# Patient Record
Sex: Female | Born: 1970
Health system: Southern US, Community
[De-identification: ages and names within clinical notes are randomized; demographics above are authoritative.]

## PROBLEM LIST (undated history)

## (undated) DIAGNOSIS — F329 Major depressive disorder, single episode, unspecified: Secondary | ICD-10-CM

## (undated) DIAGNOSIS — L719 Rosacea, unspecified: Secondary | ICD-10-CM

## (undated) DIAGNOSIS — F419 Anxiety disorder, unspecified: Secondary | ICD-10-CM

## (undated) DIAGNOSIS — R42 Dizziness and giddiness: Secondary | ICD-10-CM

## (undated) DIAGNOSIS — Z464 Encounter for fitting and adjustment of orthodontic device: Secondary | ICD-10-CM

## (undated) DIAGNOSIS — K219 Gastro-esophageal reflux disease without esophagitis: Secondary | ICD-10-CM

## (undated) DIAGNOSIS — F32A Depression, unspecified: Secondary | ICD-10-CM

## (undated) DIAGNOSIS — IMO0001 Reserved for inherently not codable concepts without codable children: Secondary | ICD-10-CM

## (undated) DIAGNOSIS — J189 Pneumonia, unspecified organism: Secondary | ICD-10-CM

## (undated) DIAGNOSIS — M67439 Ganglion, unspecified wrist: Secondary | ICD-10-CM

## (undated) DIAGNOSIS — N6320 Unspecified lump in the left breast, unspecified quadrant: Secondary | ICD-10-CM

## (undated) HISTORY — DX: Depression, unspecified: F32.A

## (undated) HISTORY — DX: Unspecified lump in the left breast, unspecified quadrant: N63.20

## (undated) HISTORY — PX: TUBAL LIGATION: SHX77

## (undated) HISTORY — DX: Major depressive disorder, single episode, unspecified: F32.9

## (undated) HISTORY — DX: Anxiety disorder, unspecified: F41.9

---
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
## 2002-06-02 HISTORY — PX: TUBAL LIGATION: SHX77

## 2012-04-10 ENCOUNTER — Emergency Department: Payer: Self-pay | Admitting: Emergency Medicine

## 2012-05-17 ENCOUNTER — Encounter: Payer: Self-pay | Admitting: Specialist

## 2012-06-02 ENCOUNTER — Encounter: Payer: Self-pay | Admitting: Specialist

## 2012-07-03 ENCOUNTER — Encounter: Payer: Self-pay | Admitting: Specialist

## 2012-10-21 ENCOUNTER — Ambulatory Visit: Payer: Self-pay | Admitting: Family Medicine

## 2015-06-29 LAB — HM PAP SMEAR: HM PAP: NEGATIVE

## 2015-07-02 ENCOUNTER — Other Ambulatory Visit: Payer: Self-pay | Admitting: Nurse Practitioner

## 2015-07-02 DIAGNOSIS — N63 Unspecified lump in unspecified breast: Secondary | ICD-10-CM

## 2015-07-05 ENCOUNTER — Other Ambulatory Visit: Payer: Self-pay | Admitting: *Deleted

## 2015-07-05 ENCOUNTER — Inpatient Hospital Stay
Admission: RE | Admit: 2015-07-05 | Discharge: 2015-07-05 | Disposition: A | Discharge status: Home / Self Care | Payer: Self-pay | Source: Ambulatory Visit | Attending: *Deleted | Admitting: *Deleted

## 2015-07-05 DIAGNOSIS — Z9289 Personal history of other medical treatment: Secondary | ICD-10-CM

## 2015-07-17 ENCOUNTER — Other Ambulatory Visit: Payer: Self-pay | Admitting: Nurse Practitioner

## 2015-07-17 ENCOUNTER — Ambulatory Visit
Admission: RE | Admit: 2015-07-17 | Discharge: 2015-07-17 | Disposition: A | Discharge status: Home / Self Care | Payer: Managed Care, Other (non HMO) | Source: Ambulatory Visit | Attending: Nurse Practitioner | Admitting: Nurse Practitioner

## 2015-07-17 DIAGNOSIS — N63 Unspecified lump in unspecified breast: Secondary | ICD-10-CM

## 2015-07-17 DIAGNOSIS — Z1231 Encounter for screening mammogram for malignant neoplasm of breast: Secondary | ICD-10-CM | POA: Diagnosis not present

## 2015-07-17 LAB — HM MAMMOGRAPHY

## 2016-10-08 ENCOUNTER — Ambulatory Visit: Payer: Self-pay | Admitting: Obstetrics and Gynecology

## 2016-10-16 ENCOUNTER — Encounter: Payer: Self-pay | Admitting: Obstetrics and Gynecology

## 2016-10-16 ENCOUNTER — Ambulatory Visit (INDEPENDENT_AMBULATORY_CARE_PROVIDER_SITE_OTHER): Payer: Commercial Managed Care - PPO | Admitting: Obstetrics and Gynecology

## 2016-10-16 VITALS — BP 90/60 | HR 72 | Ht 62.0 in | Wt 147.0 lb

## 2016-10-16 DIAGNOSIS — Z01419 Encounter for gynecological examination (general) (routine) without abnormal findings: Secondary | ICD-10-CM

## 2016-10-16 DIAGNOSIS — Z1239 Encounter for other screening for malignant neoplasm of breast: Secondary | ICD-10-CM

## 2016-10-16 DIAGNOSIS — Z113 Encounter for screening for infections with a predominantly sexual mode of transmission: Secondary | ICD-10-CM

## 2016-10-16 DIAGNOSIS — N938 Other specified abnormal uterine and vaginal bleeding: Secondary | ICD-10-CM | POA: Diagnosis not present

## 2016-10-16 DIAGNOSIS — Z1231 Encounter for screening mammogram for malignant neoplasm of breast: Secondary | ICD-10-CM

## 2016-10-16 DIAGNOSIS — N6320 Unspecified lump in the left breast, unspecified quadrant: Secondary | ICD-10-CM

## 2016-10-16 NOTE — Progress Notes (Signed)
Chief Complaint  Patient presents with  . Gynecologic Exam     HPI:      Ms. Natasha Marshall is a 46 y.o. [id number] who LMP was Patient's last menstrual period was 09/25/2016., presents today for her annual examination.  Her menses are regular every 28-30 days, lasting 2-7 days. Flow can be very light to heavy about every 4-5 months. This is new for pt over the last yr. Her mat side went through menopause in early 52s. Dysmenorrhea mild, occurring first 1-2 days of flow. She does have intermenstrual bleeding now, too, as well as vasomotor sx.   Sex activity: single partner, contraception - tubal ligation. She would like full STD testing.   Last Pap: June 29, 2015  Results were: no abnormalities /neg HPV DNA  Hx of STDs: HPV  Last mammogram: July 17, 2015  Results were: normal--routine follow-up in 12 months There is no FH of breast cancer. There is no FH of ovarian cancer. The patient does do self-breast exams. She has a hx of LT breast mass a few yrs ago that is stable. No change in size.  Tobacco use: The patient denies current or previous tobacco use. Alcohol use: social drinker Exercise: moderately active  She does get adequate calcium and Vitamin D in her diet.  She complains of bad GERD sx after she eats her dinner. Sx not during the days. She takes omeprazole nightly with sx relief. She has not had this evaluated.   Past Medical History:  Diagnosis Date  . Anxiety   . Breast mass, left    benign  . Depression     Past Surgical History:  Procedure Laterality Date  . TONSILLECTOMY  1992  . TUBAL LIGATION  2004    Family History  Problem Relation Age of Onset  . Basal cell carcinoma Maternal Grandmother 70       skin ca  . Colon cancer Maternal Grandmother 19  . COPD Maternal Grandmother   . Coronary artery disease Maternal Grandmother 54  . Stroke Maternal Grandmother   . Cancer Maternal Grandmother 18       uterine     Social History   Social History    . Marital status: Divorced    Spouse name: N/A  . Number of children: 2  . Years of education: 16   Occupational History  . Not on file.   Social History Main Topics  . Smoking status: Former Games developer  . Smokeless tobacco: Former Neurosurgeon  . Alcohol use Yes  . Drug use: No  . Sexual activity: Yes    Birth control/ protection: Surgical   Other Topics Concern  . Not on file   Social History Narrative  . No narrative on file     Current Outpatient Prescriptions:  .  doxycycline 100 mg in dextrose 5 % 250 mL, Inject 100 mg into the vein every 12 (twelve) hours., Disp: , Rfl:  .  ivermectin (STROMECTOL) 3 MG TABS tablet, TAKE 7 TABLETS AND REPEAT IN ONE WEEK., Disp: , Rfl: 3  ROS:  Review of Systems  Constitutional: Negative for fatigue, fever and unexpected weight change.  Respiratory: Negative for cough, shortness of breath and wheezing.   Cardiovascular: Negative for chest pain, palpitations and leg swelling.  Gastrointestinal: Negative for blood in stool, constipation, diarrhea, nausea and vomiting.  Endocrine: Negative for cold intolerance, heat intolerance and polyuria.  Genitourinary: Negative for dyspareunia, dysuria, flank pain, frequency, genital sores, hematuria, menstrual problem, pelvic pain, urgency,  vaginal bleeding, vaginal discharge and vaginal pain.       Breast: mass  Musculoskeletal: Negative for back pain, joint swelling and myalgias.  Skin: Negative for rash.  Neurological: Negative for dizziness, syncope, light-headedness, numbness and headaches.  Hematological: Negative for adenopathy.  Psychiatric/Behavioral: Negative for agitation, confusion, sleep disturbance and suicidal ideas. The patient is not nervous/anxious.      Objective: BP 90/60   Pulse 72   Ht 5\' 2"  (1.575 m)   Wt 147 lb (66.7 kg)   LMP 09/25/2016   BMI 26.89 kg/m    Physical Exam  Constitutional: She is oriented to person, place, and time. She appears well-developed and  well-nourished.  Genitourinary: Vagina normal and uterus normal. There is no rash or tenderness on the right labia. There is no rash or tenderness on the left labia. No erythema or tenderness in the vagina. No vaginal discharge found. Right adnexum does not display mass and does not display tenderness. Left adnexum does not display mass and does not display tenderness. Cervix does not exhibit motion tenderness or polyp. Uterus is not enlarged or tender.  Neck: Normal range of motion. No thyromegaly present.  Cardiovascular: Normal rate, regular rhythm and normal heart sounds.   No murmur heard. Pulmonary/Chest: Effort normal and breath sounds normal. Right breast exhibits no mass, no nipple discharge, no skin change and no tenderness. Left breast exhibits mass. Left breast exhibits no nipple discharge, no skin change and no tenderness.    LT BREAST 2:00 WITH 1.5 CM FIRM, MOBILE, NT MASS  Abdominal: Soft. There is no tenderness. There is no guarding.  Musculoskeletal: Normal range of motion.  Neurological: She is alert and oriented to person, place, and time. No cranial nerve deficit.  Psychiatric: She has a normal mood and affect. Her behavior is normal.  Vitals reviewed.   Assessment/Plan: Encounter for annual routine gynecological examination  Screening for breast cancer - Pt to sched mammo. - Plan: MM SCREENING BREAST TOMO BILATERAL, CANCELED: MM DIGITAL SCREENING BILATERAL  Screening for STD (sexually transmitted disease) - Plan: HIV antibody, RPR, HSV 2 antibody, IgG, Hepatitis C antibody, Chlamydia/Gonococcus/Trichomonas, NAA  DUB (dysfunctional uterine bleeding) - For the past yr. Check labs today, GYN u/s day 5-9 of cycle. Will f/u with results. - Plan: CBC with Differential/Platelet, Comprehensive metabolic panel, TSH + free T4  Breast mass, left - Stable per pt after several yrs of eval. Check mammo anyway.             GYN counsel mammography screening, adequate intake of  calcium and vitamin D, diet and exercise     F/U  Return in about 1 year (around 10/16/2017) for annual; RTO day 5-9 of menses for GYN u/s.  Sharonda Llamas B. Jashua Knaak, PA-C 10/16/2016 5:28 PM

## 2016-10-17 LAB — HIV ANTIBODY (ROUTINE TESTING W REFLEX): HIV SCREEN 4TH GENERATION: NONREACTIVE

## 2016-10-17 LAB — CBC WITH DIFFERENTIAL/PLATELET
BASOS ABS: 0 10*3/uL (ref 0.0–0.2)
Basos: 0 %
EOS (ABSOLUTE): 0.1 10*3/uL (ref 0.0–0.4)
Eos: 2 %
Hematocrit: 39 % (ref 34.0–46.6)
Hemoglobin: 13.2 g/dL (ref 11.1–15.9)
IMMATURE GRANS (ABS): 0 10*3/uL (ref 0.0–0.1)
IMMATURE GRANULOCYTES: 0 %
LYMPHS: 34 %
Lymphocytes Absolute: 2 10*3/uL (ref 0.7–3.1)
MCH: 31.1 pg (ref 26.6–33.0)
MCHC: 33.8 g/dL (ref 31.5–35.7)
MCV: 92 fL (ref 79–97)
MONOCYTES: 8 %
Monocytes Absolute: 0.5 10*3/uL (ref 0.1–0.9)
NEUTROS PCT: 56 %
Neutrophils Absolute: 3.2 10*3/uL (ref 1.4–7.0)
PLATELETS: 331 10*3/uL (ref 150–379)
RBC: 4.25 x10E6/uL (ref 3.77–5.28)
RDW: 12.7 % (ref 12.3–15.4)
WBC: 5.8 10*3/uL (ref 3.4–10.8)

## 2016-10-17 LAB — COMPREHENSIVE METABOLIC PANEL
ALBUMIN: 4.5 g/dL (ref 3.5–5.5)
ALT: 8 IU/L (ref 0–32)
AST: 11 IU/L (ref 0–40)
Albumin/Globulin Ratio: 1.7 (ref 1.2–2.2)
Alkaline Phosphatase: 48 IU/L (ref 39–117)
BILIRUBIN TOTAL: 0.5 mg/dL (ref 0.0–1.2)
BUN / CREAT RATIO: 12 (ref 9–23)
BUN: 10 mg/dL (ref 6–24)
CHLORIDE: 105 mmol/L (ref 96–106)
CO2: 22 mmol/L (ref 18–29)
Calcium: 9.4 mg/dL (ref 8.7–10.2)
Creatinine, Ser: 0.81 mg/dL (ref 0.57–1.00)
GFR calc non Af Amer: 89 mL/min/{1.73_m2} (ref >59)
GFR, EST AFRICAN AMERICAN: 103 mL/min/{1.73_m2} (ref >59)
GLUCOSE: 97 mg/dL (ref 65–99)
Globulin, Total: 2.7 g/dL (ref 1.5–4.5)
Potassium: 4.2 mmol/L (ref 3.5–5.2)
Sodium: 142 mmol/L (ref 134–144)
TOTAL PROTEIN: 7.2 g/dL (ref 6.0–8.5)

## 2016-10-17 LAB — RPR: RPR Ser Ql: NONREACTIVE

## 2016-10-17 LAB — TSH+FREE T4
Free T4: 1.25 ng/dL (ref 0.82–1.77)
TSH: 4.35 u[IU]/mL (ref 0.450–4.500)

## 2016-10-17 LAB — HEPATITIS C ANTIBODY

## 2016-10-17 LAB — HSV 2 ANTIBODY, IGG: HSV 2 Glycoprotein G Ab, IgG: <0.91 index (ref 0.00–0.90)

## 2016-10-20 LAB — CHLAMYDIA/GONOCOCCUS/TRICHOMONAS, NAA
CHLAMYDIA BY NAA: NEGATIVE
Gonococcus by NAA: NEGATIVE
Trich vag by NAA: NEGATIVE

## 2016-10-21 ENCOUNTER — Other Ambulatory Visit: Payer: Self-pay | Admitting: Obstetrics and Gynecology

## 2016-10-21 DIAGNOSIS — N938 Other specified abnormal uterine and vaginal bleeding: Secondary | ICD-10-CM

## 2016-10-28 ENCOUNTER — Ambulatory Visit (INDEPENDENT_AMBULATORY_CARE_PROVIDER_SITE_OTHER): Payer: Commercial Managed Care - PPO

## 2016-10-28 DIAGNOSIS — N938 Other specified abnormal uterine and vaginal bleeding: Secondary | ICD-10-CM

## 2016-11-06 ENCOUNTER — Ambulatory Visit
Admission: EM | Admit: 2016-11-06 | Discharge: 2016-11-06 | Disposition: A | Discharge status: Home / Self Care | Payer: Commercial Managed Care - PPO | Attending: Family Medicine | Admitting: Family Medicine

## 2016-11-06 ENCOUNTER — Ambulatory Visit: Payer: Managed Care, Other (non HMO)

## 2016-11-06 ENCOUNTER — Encounter: Payer: Self-pay | Admitting: *Deleted

## 2016-11-06 DIAGNOSIS — J9801 Acute bronchospasm: Secondary | ICD-10-CM

## 2016-11-06 DIAGNOSIS — J22 Unspecified acute lower respiratory infection: Secondary | ICD-10-CM | POA: Diagnosis not present

## 2016-11-06 HISTORY — DX: Rosacea, unspecified: L71.9

## 2016-11-06 LAB — RAPID STREP SCREEN (MED CTR MEBANE ONLY): Streptococcus, Group A Screen (Direct): NEGATIVE

## 2016-11-06 MED ORDER — PREDNISONE 10 MG (21) PO TBPK: ORAL_TABLET | ORAL | 0 refills | Status: DC

## 2016-11-06 MED ORDER — AZITHROMYCIN 250 MG PO TABS: ORAL_TABLET | ORAL | 0 refills | Status: DC

## 2016-11-06 MED ORDER — IPRATROPIUM-ALBUTEROL 0.5-2.5 (3) MG/3ML IN SOLN
3.0000 mL | Freq: Once | RESPIRATORY_TRACT | Status: AC
Start: 2016-11-06 — End: 2016-11-06
  Administered 2016-11-06: 3 mL via RESPIRATORY_TRACT

## 2016-11-06 MED ORDER — ALBUTEROL SULFATE HFA 108 (90 BASE) MCG/ACT IN AERS: 2.0000 | INHALATION_SPRAY | RESPIRATORY_TRACT | 0 refills | Status: DC | PRN

## 2016-11-06 NOTE — ED Provider Notes (Addendum)
[facility]    CSN: [id number] Arrival date & time: 11/06/16  [id number]     History   Chief Complaint Chief Complaint  Patient presents with  . Cough  . Shortness of Breath  . Sore Throat  . Fatigue  . Chills  . Generalized Body Aches    HPI Natasha Marshall is a 46 y.o. female.   Patient is a 46 year old white female who started having sore throat and coughing on Tuesday things quickly progressed and got worse. She reports having some shortness of breath by today and progressively feeling more tightness in her chest irritation with throat hoarseness and difficulty breathing. She states that she's had bronchitis many times for the past sometimes she states that using an inhaler. She reports coughing up some yellowish-green material and that this has clinically gotten worse. She does not smoke does not dip snuff to tobacco she's had a tubal ligation breast biopsy and tonsillectomy. No known drug allergies and is a history of asthma in the family but she is to have been diagnosed with asthma.   The history is provided by the patient. No language interpreter was used.  Cough  Cough characteristics:  Productive Sputum characteristics:  Nondescript Timing:  Constant Progression:  Waxing and waning Chronicity:  New Smoker: no   Context: upper respiratory infection   Context: not smoke exposure   Relieved by:  Nothing Worsened by:  Nothing Ineffective treatments:  None tried Associated symptoms: shortness of breath, sore throat and wheezing   Shortness of Breath  Associated symptoms: cough, sore throat and wheezing   Sore Throat  Associated symptoms include shortness of breath.    Past Medical History:  Diagnosis Date  . Anxiety   . Breast mass, left    benign  . Depression   . Optical Rosacea     There are no active problems to display for this patient.   Past Surgical History:  Procedure Laterality Date  . TONSILLECTOMY  1992  . TUBAL LIGATION  2004  .  TUBAL LIGATION      OB History    Gravida Para Term Preterm AB Living   2 2 2     2    SAB TAB Ectopic Multiple Live Births           2       Home Medications    Prior to Admission medications   Medication Sig Start Date End Date Taking? Authorizing Provider  doxycycline (VIBRA-TABS) 100 MG tablet Take 200 mg by mouth 2 (two) times daily.   Yes [provider]  albuterol (PROVENTIL HFA;VENTOLIN HFA) 108 (90 Base) MCG/ACT inhaler Inhale 2 puffs into the lungs every 4 (four) hours as needed for wheezing or shortness of breath. 11/06/16   Hassan RowanWade, Hubert Derstine, MD  azithromycin (ZITHROMAX Z-PAK) 250 MG tablet Take 2 tablets first day and then 1 po a day for 4 days 11/06/16   Hassan RowanWade, Farzad Tibbetts, MD  doxycycline 100 mg in dextrose 5 % 250 mL Inject 100 mg into the vein every 12 (twelve) hours.    [provider]  ivermectin (STROMECTOL) 3 MG TABS tablet TAKE 7 TABLETS AND REPEAT IN ONE WEEK. 08/25/16   [provider]  predniSONE (STERAPRED UNI-PAK 21 TAB) 10 MG (21) TBPK tablet Sig 6 tablet day 1, 5 tablets day 2, 4 tablets day 3,,3tablets day 4, 2 tablets day 5, 1 tablet day 6 take all tablets orally 11/06/16   Hassan RowanWade, Lenix Kidd, MD    Family History  Family History  Problem Relation Age of Onset  . Basal cell carcinoma Maternal Grandmother 70       skin ca  . Colon cancer Maternal Grandmother 36  . COPD Maternal Grandmother   . Coronary artery disease Maternal Grandmother 54  . Stroke Maternal Grandmother   . Cancer Maternal Grandmother 42       uterine     Social History Social History  Substance Use Topics  . Smoking status: Former Games developer  . Smokeless tobacco: Former Neurosurgeon  . Alcohol use Yes     Allergies   Patient has no known allergies.   Review of Systems Review of Systems  HENT: Positive for sore throat.   Respiratory: Positive for cough, shortness of breath and wheezing.   All other systems reviewed and are negative.    Physical Exam Triage Vital Signs ED  Triage Vitals  Enc Vitals Group     BP 11/06/16 0851 112/62     Pulse Rate 11/06/16 0851 66     Resp 11/06/16 0851 16     Temp 11/06/16 0851 98.4 F (36.9 C)     Temp Source 11/06/16 0851 Oral     SpO2 11/06/16 0851 100 %     Weight 11/06/16 0853 140 lb (63.5 kg)     Height 11/06/16 0853 5\' 2"  (1.575 m)     Head Circumference --      Peak Flow --      Pain Score --      Pain Loc --      Pain Edu? --      Excl. in GC? --    No data found.   Updated Vital Signs BP 112/62 (BP Location: Left Arm)   Pulse 66   Temp 98.4 F (36.9 C) (Oral)   Resp 16   Ht 5\' 2"  (1.575 m)   Wt 140 lb (63.5 kg)   LMP 10/20/2016 (Exact Date)   SpO2 100%   BMI 25.61 kg/m   Visual Acuity Right Eye Distance:   Left Eye Distance:   Bilateral Distance:    Right Eye Near:   Left Eye Near:    Bilateral Near:     Physical Exam  Constitutional: She is oriented to person, place, and time. She appears well-developed and well-nourished. No distress.  HENT:  Head: Normocephalic and atraumatic.  Eyes: Conjunctivae and EOM are normal. Pupils are equal, round, and reactive to light.  Neck: Normal range of motion. Neck supple.  Cardiovascular: Normal rate and regular rhythm.   Pulmonary/Chest: Effort normal. She has decreased breath sounds. She has no wheezes.  Musculoskeletal: Normal range of motion.  Neurological: She is alert and oriented to person, place, and time.  Skin: Skin is warm and dry.  Psychiatric: She has a normal mood and affect.  Vitals reviewed.    UC Treatments / Results  Labs (all labs ordered are listed, but only abnormal results are displayed) Labs Reviewed  RAPID STREP SCREEN (NOT AT [facility])  CULTURE, GROUP A STREP [facility])    EKG  EKG Interpretation None       Radiology No results found.  Procedures Procedures (including critical care time)  Medications Ordered in UC Medications  ipratropium-albuterol (DUONEB) 0.5-2.5 (3) MG/3ML nebulizer solution 3 mL (3 mLs  Nebulization Given 11/06/16 0908)   Results for orders placed or performed during the hospital encounter of 11/06/16  Rapid strep screen  Result Value Ref Range   Streptococcus, Group A Screen (Direct) NEGATIVE NEGATIVE  Initial Impression / Assessment and Plan / UC Course  I have reviewed the triage vital signs and the nursing notes.  Pertinent labs & imaging results that were available during my care of the patient were reviewed by me and considered in my medical decision making (see chart for details).    patient be given DuoNeb treatment will place her on prednisone 6 day course of Z-Pak and will also give her an albuterol inhaler at work note for today and tomorrow as well follow-up with PCP in 1-2 days if needed.  Patient did feel better after DuoNeb treatment  Final Clinical Impressions(s) / UC Diagnoses   Final diagnoses:  Lower respiratory infection  Acute bronchospasm    New Prescriptions Discharge Medication List as of 11/06/2016  9:28 AM    START taking these medications   Details  albuterol (PROVENTIL HFA;VENTOLIN HFA) 108 (90 Base) MCG/ACT inhaler Inhale 2 puffs into the lungs every 4 (four) hours as needed for wheezing or shortness of breath., Starting Thu 11/06/2016, Normal    azithromycin (ZITHROMAX Z-PAK) 250 MG tablet Take 2 tablets first day and then 1 po a day for 4 days, Normal    predniSONE (STERAPRED UNI-PAK 21 TAB) 10 MG (21) TBPK tablet Sig 6 tablet day 1, 5 tablets day 2, 4 tablets day 3,,3tablets day 4, 2 tablets day 5, 1 tablet day 6 take all tablets orally, Print        Note: This dictation was prepared with Dragon dictation along with smaller phrase technology. Any transcriptional errors that result from this process are unintentional.   Hassan Rowan, MD 11/06/16 1001    Hassan Rowan, MD 11/06/16 1001

## 2016-11-06 NOTE — ED Triage Notes (Signed)
Non-productive cough, dyspnea, chills, sore throat, head and chest congestion x 2 days.

## 2016-11-08 LAB — CULTURE, GROUP A STREP (THRC)

## 2016-11-25 ENCOUNTER — Ambulatory Visit
Admission: RE | Admit: 2016-11-25 | Discharge: 2016-11-25 | Disposition: A | Discharge status: Home / Self Care | Payer: Commercial Managed Care - PPO | Source: Ambulatory Visit | Attending: Obstetrics and Gynecology | Admitting: Obstetrics and Gynecology

## 2016-11-25 DIAGNOSIS — Z1239 Encounter for other screening for malignant neoplasm of breast: Secondary | ICD-10-CM

## 2016-11-25 DIAGNOSIS — Z1231 Encounter for screening mammogram for malignant neoplasm of breast: Secondary | ICD-10-CM | POA: Diagnosis not present

## 2016-11-26 ENCOUNTER — Encounter: Payer: Self-pay | Admitting: Obstetrics and Gynecology

## 2017-07-22 ENCOUNTER — Ambulatory Visit
Admission: EM | Admit: 2017-07-22 | Discharge: 2017-07-22 | Disposition: A | Discharge status: Home / Self Care | Payer: BLUE CROSS/BLUE SHIELD | Attending: Emergency Medicine | Admitting: Emergency Medicine

## 2017-07-22 ENCOUNTER — Other Ambulatory Visit: Payer: Self-pay

## 2017-07-22 DIAGNOSIS — J22 Unspecified acute lower respiratory infection: Secondary | ICD-10-CM

## 2017-07-22 DIAGNOSIS — R059 Cough, unspecified: Secondary | ICD-10-CM

## 2017-07-22 DIAGNOSIS — R05 Cough: Secondary | ICD-10-CM | POA: Diagnosis not present

## 2017-07-22 MED ORDER — ALBUTEROL SULFATE HFA 108 (90 BASE) MCG/ACT IN AERS: 1.0000 | INHALATION_SPRAY | Freq: Four times a day (QID) | RESPIRATORY_TRACT | 0 refills | Status: DC | PRN

## 2017-07-22 MED ORDER — PREDNISONE 10 MG (21) PO TBPK: ORAL_TABLET | ORAL | 0 refills | Status: DC

## 2017-07-22 MED ORDER — AZITHROMYCIN 250 MG PO TABS: 250.0000 mg | ORAL_TABLET | Freq: Every day | ORAL | 0 refills | Status: DC

## 2017-07-22 MED ORDER — BENZONATATE 200 MG PO CAPS: 200.0000 mg | ORAL_CAPSULE | Freq: Three times a day (TID) | ORAL | 0 refills | Status: DC | PRN

## 2017-07-22 MED ORDER — HYDROCOD POLST-CPM POLST ER 10-8 MG/5ML PO SUER: 5.0000 mL | Freq: Two times a day (BID) | ORAL | 0 refills | Status: DC | PRN

## 2017-07-22 MED ORDER — AEROCHAMBER PLUS MISC: 2 refills | Status: DC

## 2017-07-22 MED ORDER — IBUPROFEN 600 MG PO TABS: 600.0000 mg | ORAL_TABLET | Freq: Four times a day (QID) | ORAL | 0 refills | Status: DC | PRN

## 2017-07-22 NOTE — ED Provider Notes (Signed)
HPI  SUBJECTIVE:  Natasha Marshall is a 47 y.o. female who presents with nonproductive cough, shortness of breath, shortness of breath with exertion, chest congestion and rib soreness secondary to the cough starting yesterday.  She states that if she feels that she has "something caught in my throat".  States this feels identical to previous episode of bronchitis.  She tried NyQuil with improvement in her symptoms.  Cough and shortness of breath is worse with lying down.  She denies fevers, body aches.  She reports headaches.  No nasal congestion, rhinorrhea, postnasal drip, sinus pain or pressure, GERD symptoms.  States that she is waking up at night coughing.  No antibiotics in the past month.  No antipyretic in the past 6-8 hours.  She has a past medical history of bronchitis, smoked occasionally in college.  Quit years ago.  She has a past medical history of seasonal allergies but states that they are not bothering her.  History of diabetes, hypertension.  LMP: 1/30.  PMD: None.  Patient was seen here in 10/2016 for the same, thought to have bronchitis, sent home with albuterol, prednisone 6-day taper and a Z-Pak.  States that the albuterol and prednisone helped.  Past Medical History:  Diagnosis Date  . Anxiety   . Breast mass, left    benign  . Depression   . Optical Rosacea     Past Surgical History:  Procedure Laterality Date  . TONSILLECTOMY  1992  . TUBAL LIGATION  2004  . TUBAL LIGATION      Family History  Problem Relation Age of Onset  . Basal cell carcinoma Maternal Grandmother 70       skin ca  . Colon cancer Maternal Grandmother 36  . COPD Maternal Grandmother   . Coronary artery disease Maternal Grandmother 54  . Stroke Maternal Grandmother   . Cancer Maternal Grandmother 49       uterine   . Breast cancer Neg Hx     Social History   Tobacco Use  . Smoking status: Former Games developer  . Smokeless tobacco: Never Used  Substance Use Topics  . Alcohol use: Yes     Comment: social  . Drug use: No    No current facility-administered medications for this encounter.   Current Outpatient Medications:  .  albuterol (PROVENTIL HFA;VENTOLIN HFA) 108 (90 Base) MCG/ACT inhaler, Inhale 1-2 puffs into the lungs every 6 (six) hours as needed for wheezing or shortness of breath., Disp: 1 Inhaler, Rfl: 0 .  azithromycin (ZITHROMAX) 250 MG tablet, Take 1 tablet (250 mg total) by mouth daily. 2 tabs po on day 1, 1 tab po on days 2-5, Disp: 6 tablet, Rfl: 0 .  benzonatate (TESSALON) 200 MG capsule, Take 1 capsule (200 mg total) by mouth 3 (three) times daily as needed for cough., Disp: 30 capsule, Rfl: 0 .  chlorpheniramine-HYDROcodone (TUSSIONEX PENNKINETIC ER) 10-8 MG/5ML SUER, Take 5 mLs by mouth every 12 (twelve) hours as needed for cough., Disp: 120 mL, Rfl: 0 .  ibuprofen (ADVIL,MOTRIN) 600 MG tablet, Take 1 tablet (600 mg total) by mouth every 6 (six) hours as needed., Disp: 30 tablet, Rfl: 0 .  predniSONE (STERAPRED UNI-PAK 21 TAB) 10 MG (21) TBPK tablet, Dispense one 6 day pack. Take as directed with food., Disp: 21 tablet, Rfl: 0 .  Spacer/Aero-Holding Chambers (AEROCHAMBER PLUS) inhaler, Use as instructed, Disp: 1 each, Rfl: 2  No Known Allergies   ROS  As noted in HPI.   Physical Exam  BP 109/66 (BP Location: Left Arm)   Pulse 76   Temp 98.2 F (36.8 C) (Oral)   Resp 18   Ht 5\' 2"  (1.575 m)   Wt 152 lb (68.9 kg)   LMP 07/01/2017   SpO2 100%   BMI 27.80 kg/m   Constitutional: Well developed, well nourished, no acute distress Eyes:  EOMI, conjunctiva normal bilaterally HENT: Normocephalic, atraumatic,mucus membranes moist.  No nasal congestion.  No sinus tenderness.  No postnasal drip.  Normal oropharynx. Respiratory: Normal inspiratory effort lungs clear bilaterally.  Good air movement.  Positive diffuse chest wall tenderness Cardiovascular: Normal rate regular rhythm, no murmurs, rubs, gallops. GI: nondistended skin: No rash, skin  intact Musculoskeletal: no deformities Neurologic: Alert & oriented x 3, no focal neuro deficits Psychiatric: Speech and behavior appropriate   ED Course   Medications - No data to display  No orders of the defined types were placed in this encounter.   No results found for this or any previous visit (from the past 24 hour(s)). No results found.  ED Clinical Impression  Cough  LRTI (lower respiratory tract infection)   ED Assessment/Plan  Previous records reviewed.  As noted in HPI.    Patient with URI, perhaps early bronchitis.  No focal lung findings, 100% on room air, deferring chest x-ray.  No body aches, fevers, deferred flu testing. Pt is amenable to this.  discussed with her that this is most likely viral.  States she no longer has an albuterol inhaler.  Will refill the albuterol inhaler with a spacer, sent home with a prednisone 6-day taper, Tessalon for the cough during the day, Tussionex for the cough at night.  Also wait-and-see azithromycin prescription as she does not have a primary care physician and does not wish to return here for reevaluation.  Advised her to not fill it now.  Return here as needed.  We will also provide a primary care referral list for routine care.  Discussed medical decision-making, plan for follow-up with patient.  She agrees with plan.  [place] Narcotic database reviewed for this patient, and feel that the risk/benefit ratio today is favorable for proceeding with a prescription for controlled substance.  No opiate prescriptions in 2 years.   Meds ordered this encounter  Medications  . Spacer/Aero-Holding Chambers (AEROCHAMBER PLUS) inhaler    Sig: Use as instructed    Dispense:  1 each    Refill:  2  . albuterol (PROVENTIL HFA;VENTOLIN HFA) 108 (90 Base) MCG/ACT inhaler    Sig: Inhale 1-2 puffs into the lungs every 6 (six) hours as needed for wheezing or shortness of breath.    Dispense:  1 Inhaler    Refill:  0  .  chlorpheniramine-HYDROcodone (TUSSIONEX PENNKINETIC ER) 10-8 MG/5ML SUER    Sig: Take 5 mLs by mouth every 12 (twelve) hours as needed for cough.    Dispense:  120 mL    Refill:  0  . benzonatate (TESSALON) 200 MG capsule    Sig: Take 1 capsule (200 mg total) by mouth 3 (three) times daily as needed for cough.    Dispense:  30 capsule    Refill:  0  . ibuprofen (ADVIL,MOTRIN) 600 MG tablet    Sig: Take 1 tablet (600 mg total) by mouth every 6 (six) hours as needed.    Dispense:  30 tablet    Refill:  0  . predniSONE (STERAPRED UNI-PAK 21 TAB) 10 MG (21) TBPK tablet    Sig: Dispense one 6  day pack. Take as directed with food.    Dispense:  21 tablet    Refill:  0  . azithromycin (ZITHROMAX) 250 MG tablet    Sig: Take 1 tablet (250 mg total) by mouth daily. 2 tabs po on day 1, 1 tab po on days 2-5    Dispense:  6 tablet    Refill:  0    *This clinic note was created using Scientist, clinical (histocompatibility and immunogenetics). Therefore, there may be occasional mistakes despite careful proofreading.   ?   Domenick Gong, MD 07/22/17 1043

## 2017-07-22 NOTE — ED Triage Notes (Signed)
Pt reports "I feel just like I did the last time I had bronchitis."

## 2017-07-22 NOTE — Discharge Instructions (Signed)
2 puffs from your albuterol inhaler using a spacer every 4-6 hours as needed for coughing, wheezing, and breath.  Finish the steroids.  Tessalon for the cough during the day, Tussionex for the cough at night.  Do not fill the azithromycin prescription now.  Wait as we discussed.  Follow-up with a primary care physician of your choice.  See list below.  Here is a list of primary care providers who are taking new patients:  Dr. Elizabeth Sauereanna Jones, Dr. Schuyler AmorWilliam Plonk [address] [place] [address] [phone]  [facility] [place] [address] [place] [address]  [place] [address]  [phone]  [facility] [address]  [place], [place] [phone]  [facility] [place] [address]  [phone] [place], [place] [address]  Here are clinics/ other resources who will see you if you do not have insurance. Some have certain criteria that you must meet. Call them and find out what they are:  [facility]: [address] [place] [address] [place], [place] [address] Phone: [phone] Hours: First and Third Saturdays of each Month, 9 a.m. - 1 p.m.  [facility]: [address], [place], [place] [address] Phone: [phone] Hours: Tuesday, 4 p.m. - 8 p.m. Thursday, 1 p.m. - 8 p.m. Wednesday, 9 a.m. - [facility]   [facility] [address], [place], [place] [address] Phone: [phone] Pharmacy Phone Number: [phone] Dental Phone Number: [phone] [facility] Insurance Help: [phone]  Dental Hours: Monday - Thursday, 8 a.m. - 6 p.m.  [facility] [address] [place], [place] [address] Phone: [phone] Pharmacy Phone Number: [phone] [facility] Insurance Help: [phone]  [facility] [address] [place], [place] [address] Phone: [phone] Pharmacy Phone Number: [phone] [facility] Insurance Help: [phone]  [facility] [address] [place],  [place] [address] Phone: [phone] [facility] Insurance Help: [phone]   [facility] [address] [place], [place] [address] Phone: [phone]  Go to www.goodrx.com to look up your medications. This will give you a list of where you can find your prescriptions at the most affordable prices. Or ask the pharmacist what the cash price is, or if they have any other discount programs available to help make your medication more affordable. This can be less expensive than what you would pay with insurance.

## 2017-10-22 ENCOUNTER — Encounter: Payer: Self-pay | Admitting: Obstetrics and Gynecology

## 2017-11-27 ENCOUNTER — Ambulatory Visit (INDEPENDENT_AMBULATORY_CARE_PROVIDER_SITE_OTHER): Payer: BLUE CROSS/BLUE SHIELD | Admitting: Obstetrics and Gynecology

## 2017-11-27 ENCOUNTER — Encounter: Payer: Self-pay | Admitting: Obstetrics and Gynecology

## 2017-11-27 VITALS — BP 98/62 | HR 65 | Ht 62.0 in | Wt 161.0 lb

## 2017-11-27 DIAGNOSIS — Z124 Encounter for screening for malignant neoplasm of cervix: Secondary | ICD-10-CM

## 2017-11-27 DIAGNOSIS — Z1239 Encounter for other screening for malignant neoplasm of breast: Secondary | ICD-10-CM

## 2017-11-27 DIAGNOSIS — Z0000 Encounter for general adult medical examination without abnormal findings: Secondary | ICD-10-CM

## 2017-11-27 DIAGNOSIS — Z01419 Encounter for gynecological examination (general) (routine) without abnormal findings: Secondary | ICD-10-CM

## 2017-11-27 DIAGNOSIS — Z1231 Encounter for screening mammogram for malignant neoplasm of breast: Secondary | ICD-10-CM

## 2017-11-27 DIAGNOSIS — Z113 Encounter for screening for infections with a predominantly sexual mode of transmission: Secondary | ICD-10-CM

## 2017-11-27 NOTE — Progress Notes (Incomplete)
Gynecology Annual Exam  PCP: Patient, No Pcp Per  Chief Complaint:  Chief Complaint  Patient presents with  . Gynecologic Exam    History of Present Illness: Patient is a 47 y.o. [id number] presents for annual exam. The patient has no complaints today.   LMP: Patient's last menstrual period was 09/14/2017 (exact date). Average Interval: irregular, 50-75 days Duration of flow: 5 days Heavy Menses: no Clots: no Intermenstrual Bleeding: no Postcoital Bleeding: no Dysmenorrhea: no   The patient is sexually active. She currently uses tubal ligation for contraception. She denies dyspareunia.  The patient does perform self breast exams.  There is no notable family history of breast or ovarian cancer in her family.  The patient wears seatbelts: yes.   The patient has regular exercise: yes.    The patient denies current symptoms of depression.    Review of Systems: ROS  Past Medical History:  Past Medical History:  Diagnosis Date  . Anxiety   . Breast mass, left    benign  . Depression   . Optical Rosacea     Past Surgical History:  Past Surgical History:  Procedure Laterality Date  . TONSILLECTOMY  1992  . TUBAL LIGATION  2004  . TUBAL LIGATION      Gynecologic History:  Patient's last menstrual period was 09/14/2017 (exact date). Contraception: tubal ligation Last Pap: Results were: 2017 NIL  Last mammogram: 2018 Results were: BI-RAD I  Obstetric History: [id number]: G2P2002  Family History:  Family History  Problem Relation Age of Onset  . Basal cell carcinoma Maternal Grandmother 70       skin ca  . Colon cancer Maternal Grandmother 8764  . COPD Maternal Grandmother   . Coronary artery disease Maternal Grandmother 54  . Stroke Maternal Grandmother   . Cancer Maternal Grandmother 49       uterine   . Breast cancer Neg Hx     Social History:  Social History   Socioeconomic History  . Marital status: Divorced    Spouse name: Not on file  . Number of children: 2   . Years of education: 2916  . Highest education level: Not on file  Occupational History  . Not on file  Social Needs  . Financial resource strain: Not on file  . Food insecurity:    Worry: Not on file    Inability: Not on file  . Transportation needs:    Medical: Not on file    Non-medical: Not on file  Tobacco Use  . Smoking status: Former Games developermoker  . Smokeless tobacco: Never Used  Substance and Sexual Activity  . Alcohol use: Yes    Comment: social  . Drug use: No  . Sexual activity: Yes    Birth control/protection: Surgical    Comment: Tubal ligation   Lifestyle  . Physical activity:    Days per week: Not on file    Minutes per session: Not on file  . Stress: Not on file  Relationships  . Social connections:    Talks on phone: Not on file    Gets together: Not on file    Attends religious service: Not on file    Active member of club or organization: Not on file    Attends meetings of clubs or organizations: Not on file    Relationship status: Not on file  . Intimate partner violence:    Fear of current or ex partner: Not on file    Emotionally abused: Not on  file    Physically abused: Not on file    Forced sexual activity: Not on file  Other Topics Concern  . Not on file  Social History Narrative  . Not on file    Allergies:  No Known Allergies  Medications: Prior to Admission medications   Medication Sig Start Date End Date Taking? Authorizing Provider  Doxycycline Hyclate 200 MG TBEC Take by mouth.   Yes [provider]  ivermectin (STROMECTOL) 3 MG TABS tablet TK 7 TS PO AT ONCE AND REPEAT 1 WEEK LATER. WAIT 2 MONTHS TO REPEAT 11/24/17  Yes [provider]  SOOLANTRA 1 % CREA 1(ONE) APPLICATION(S) TOPICAL EVERY DAY 11/24/17  Yes [provider]    Physical Exam Vitals: Blood pressure 98/62, pulse 65, height 5\' 2"  (1.575 m), weight 161 lb (73 kg), last menstrual period 09/14/2017.  General: NAD HEENT: normocephalic,  anicteric Thyroid: no enlargement, no palpable nodules Pulmonary: No increased work of breathing, CTAB Cardiovascular: RRR, distal pulses 2+ Breast: Breast symmetrical, no tenderness,***, no skin or nipple retraction present, no nipple discharge.  No axillary or supraclavicular lymphadenopathy. Abdomen: NABS, soft, non-tender, non-distended.  Umbilicus without lesions.  No hepatomegaly, splenomegaly or masses palpable. No evidence of hernia  Genitourinary:  External: Normal external female genitalia.  Normal urethral meatus, normal Bartholin's and Skene's glands.    Vagina: Normal vaginal mucosa, no evidence of prolapse.    Cervix: Grossly normal in appearance, no bleeding  Uterus: Non-enlarged, mobile, normal contour.  No CMT  Adnexa: ovaries non-enlarged, no adnexal masses  Rectal: deferred  Lymphatic: no evidence of inguinal lymphadenopathy Extremities: no edema, erythema, or tenderness Neurologic: Grossly intact Psychiatric: mood appropriate, affect full  Female chaperone present for pelvic and breast  portions of the physical exam    Assessment: 47 y.o. G2P2002 routine annual exam  Plan: Problem List Items Addressed This Visit    None    Visit Diagnoses    Health care maintenance    -  Primary   Screening for breast cancer       Encounter for annual routine gynecological examination       Visit for pelvic exam          1) Mammogram - recommend yearly screening mammogram.  Mammogram Was ordered today  2) STI screening  was offered and accepted.   3) ASCCP guidelines and rational discussed.  Patient opts for yearly screening interval  4) Contraception - the patient is currently using  tubal ligation.  She is happy with her current form of contraception and plans to continue  5) Colonoscopy -- Screening recommended starting at age 60 for average risk individuals, age 60 for individuals deemed at increased risk (including African Americans) and recommended to continue  until age 72.  For patient age 31-85 individualized approach is recommended.  Gold standard screening is via colonoscopy, Cologuard screening is an acceptable alternative for patient unwilling or unable to undergo colonoscopy.  "Colorectal cancer screening for average?risk adults: 2018 guideline update from the American Cancer Society"CA: A Cancer Journal for Clinicians: Oct 29, 2016   6) Routine healthcare maintenance including cholesterol, diabetes screening discussed Ordered today. She will follow up for her lipid panel.  7) No follow-ups on file.  Adelene Idler MD [facility], [facility] 11/27/17 2:50 PM

## 2017-12-01 LAB — CBC WITH DIFFERENTIAL
BASOS ABS: 0 10*3/uL (ref 0.0–0.2)
BASOS: 0 %
EOS (ABSOLUTE): 0.1 10*3/uL (ref 0.0–0.4)
Eos: 2 %
HEMATOCRIT: 40.6 % (ref 34.0–46.6)
HEMOGLOBIN: 13.1 g/dL (ref 11.1–15.9)
IMMATURE GRANS (ABS): 0 10*3/uL (ref 0.0–0.1)
Immature Granulocytes: 0 %
LYMPHS ABS: 2.6 10*3/uL (ref 0.7–3.1)
LYMPHS: 35 %
MCH: 30.5 pg (ref 26.6–33.0)
MCHC: 32.3 g/dL (ref 31.5–35.7)
MCV: 95 fL (ref 79–97)
MONOCYTES: 7 %
Monocytes Absolute: 0.5 10*3/uL (ref 0.1–0.9)
NEUTROS ABS: 4.1 10*3/uL (ref 1.4–7.0)
Neutrophils: 56 %
RBC: 4.29 x10E6/uL (ref 3.77–5.28)
RDW: 13 % (ref 12.3–15.4)
WBC: 7.5 10*3/uL (ref 3.4–10.8)

## 2017-12-01 LAB — HSV(HERPES SMPLX)ABS-I+II(IGG+IGM)-BLD
HSV 1 Glycoprotein G Ab, IgG: 26 index — ABNORMAL HIGH (ref 0.00–0.90)
HSV 2 IgG, Type Spec: <0.91 index (ref 0.00–0.90)
HSVI/II Comb IgM: <0.91 Ratio (ref 0.00–0.90)

## 2017-12-01 LAB — COMPREHENSIVE METABOLIC PANEL
A/G RATIO: 1.7 (ref 1.2–2.2)
ALT: 15 IU/L (ref 0–32)
AST: 15 IU/L (ref 0–40)
Albumin: 4.7 g/dL (ref 3.5–5.5)
Alkaline Phosphatase: 65 IU/L (ref 39–117)
BILIRUBIN TOTAL: 0.4 mg/dL (ref 0.0–1.2)
BUN/Creatinine Ratio: 19 (ref 9–23)
BUN: 15 mg/dL (ref 6–24)
CHLORIDE: 105 mmol/L (ref 96–106)
CO2: 22 mmol/L (ref 20–29)
Calcium: 9.6 mg/dL (ref 8.7–10.2)
Creatinine, Ser: 0.77 mg/dL (ref 0.57–1.00)
GFR calc non Af Amer: 94 mL/min/{1.73_m2} (ref >59)
GFR, EST AFRICAN AMERICAN: 109 mL/min/{1.73_m2} (ref >59)
GLOBULIN, TOTAL: 2.7 g/dL (ref 1.5–4.5)
GLUCOSE: 90 mg/dL (ref 65–99)
Potassium: 4.2 mmol/L (ref 3.5–5.2)
SODIUM: 140 mmol/L (ref 134–144)
TOTAL PROTEIN: 7.4 g/dL (ref 6.0–8.5)

## 2017-12-01 LAB — HEPATITIS C ANTIBODY

## 2017-12-01 LAB — HEP, RPR, HIV PANEL
HIV Screen 4th Generation wRfx: NONREACTIVE
Hepatitis B Surface Ag: NEGATIVE
RPR Ser Ql: NONREACTIVE

## 2017-12-01 NOTE — Progress Notes (Signed)
HSV1 positive, other labs normal, released to mychart.

## 2017-12-02 LAB — PAPIG, CTNGTV, HPV, RFX 16/18
Chlamydia, Nuc. Acid Amp: NEGATIVE
GONOCOCCUS, NUC. ACID AMP: NEGATIVE
HPV, HIGH-RISK: NEGATIVE
PAP SMEAR COMMENT: 0
Trich vag by NAA: NEGATIVE

## 2017-12-17 ENCOUNTER — Other Ambulatory Visit: Payer: BLUE CROSS/BLUE SHIELD

## 2018-12-01 DIAGNOSIS — L578 Other skin changes due to chronic exposure to nonionizing radiation: Secondary | ICD-10-CM | POA: Diagnosis not present

## 2018-12-01 DIAGNOSIS — L718 Other rosacea: Secondary | ICD-10-CM | POA: Diagnosis not present

## 2018-12-31 ENCOUNTER — Ambulatory Visit: Payer: BLUE CROSS/BLUE SHIELD | Admitting: Obstetrics and Gynecology

## 2019-01-21 ENCOUNTER — Ambulatory Visit (INDEPENDENT_AMBULATORY_CARE_PROVIDER_SITE_OTHER): Payer: BC Managed Care – PPO | Admitting: Maternal Newborn

## 2019-01-21 ENCOUNTER — Other Ambulatory Visit: Payer: Self-pay

## 2019-01-21 ENCOUNTER — Encounter: Payer: Self-pay | Admitting: Maternal Newborn

## 2019-01-21 VITALS — BP 100/80 | Ht 62.0 in | Wt 169.4 lb

## 2019-01-21 DIAGNOSIS — Z1231 Encounter for screening mammogram for malignant neoplasm of breast: Secondary | ICD-10-CM

## 2019-01-21 DIAGNOSIS — Z01419 Encounter for gynecological examination (general) (routine) without abnormal findings: Secondary | ICD-10-CM | POA: Diagnosis not present

## 2019-01-21 NOTE — Patient Instructions (Signed)

## 2019-01-21 NOTE — Progress Notes (Signed)
Gynecology Annual Exam  PCP: Natasha Marshall, No Pcp Per  Chief Complaint:  Chief Complaint  Natasha Marshall presents with  . Gynecologic Exam    lot of heartburn lately    History of Present Illness: Natasha Marshall is a 48 y.o. [id number] presenting for an annual exam.   LMP: Natasha Marshall's last menstrual period was 12/12/2018 (exact date). Average Interval: irregular, has had 4 cycles in the past year Duration of flow: Varies from 2 days heavy flow to 4 days light flow Intermenstrual Bleeding: no Postcoital Bleeding: no Dysmenorrhea: no  The Natasha Marshall is sexually active. She currently uses tubal ligation for contraception. She does not have dyspareunia.  The Natasha Marshall does perform self breast exams.  There is no notable family history of breast or ovarian cancer in her family.  The Natasha Marshall wears seatbelts: yes.   The Natasha Marshall has regular exercise: yes, walks 1 mile almost every day.    The Natasha Marshall does not have current symptoms of depression.  Domestic violence screening is negative.    She is taking OTC omeprazole for heartburn. Does not desire to try any medication for hot flashes.  She has had a stable left breast mass for the past few years, no changes in size  Review of Systems  Constitutional: Negative.   HENT: Negative.   Eyes: Negative.   Respiratory: Negative for shortness of breath and wheezing.   Cardiovascular: Negative for chest pain and palpitations.  Gastrointestinal: Positive for heartburn.  Genitourinary: Negative.   Musculoskeletal: Negative.   Skin: Negative.   Neurological: Negative.   Endo/Heme/Allergies: Positive for environmental allergies.       Hot flashes  Psychiatric/Behavioral: Negative.   All other systems reviewed and are negative.   Past Medical History:  Past Medical History:  Diagnosis Date  . Anxiety   . Breast mass, left    benign  . Depression   . Optical Rosacea     Past Surgical History:  Past Surgical History:  Procedure Laterality Date  .  TONSILLECTOMY  1992  . TUBAL LIGATION  2004  . TUBAL LIGATION      Gynecologic History:  Natasha Marshall's last menstrual period was 12/12/2018 (exact date). Contraception: tubal ligation Last Pap: 11/27/2017. Results were:  NIL and HR HPV negative  Last mammogram: 11/26/2016.  Results were: BI-RAD I Obstetric History: [id number]  Family History:  Family History  Problem Relation Age of Onset  . Basal cell carcinoma Maternal Grandmother 70       skin ca  . Colon cancer Maternal Grandmother 1364  . COPD Maternal Grandmother   . Coronary artery disease Maternal Grandmother 54  . Stroke Maternal Grandmother   . Cancer Maternal Grandmother 49       uterine   . Breast cancer Neg Hx     Social History:  Social History   Socioeconomic History  . Marital status: Divorced    Spouse name: Not on file  . Number of children: 2  . Years of education: 6816  . Highest education level: Not on file  Occupational History  . Not on file  Social Needs  . Financial resource strain: Not on file  . Food insecurity    Worry: Not on file    Inability: Not on file  . Transportation needs    Medical: Not on file    Non-medical: Not on file  Tobacco Use  . Smoking status: Former Games developermoker  . Smokeless tobacco: Never Used  Substance and Sexual Activity  . Alcohol use: Yes  Comment: social  . Drug use: No  . Sexual activity: Yes    Birth control/protection: Surgical    Comment: Tubal ligation   Lifestyle  . Physical activity    Days per week: Not on file    Minutes per session: Not on file  . Stress: Not on file  Relationships  . Social Herbalist on phone: Not on file    Gets together: Not on file    Attends religious service: Not on file    Active member of club or organization: Not on file    Attends meetings of clubs or organizations: Not on file    Relationship status: Not on file  . Intimate partner violence    Fear of current or ex partner: Not on file    Emotionally abused:  Not on file    Physically abused: Not on file    Forced sexual activity: Not on file  Other Topics Concern  . Not on file  Social History Narrative  . Not on file    Allergies:  No Known Allergies  Medications: Prior to Admission medications   Medication Sig Start Date End Date Taking? Authorizing Provider  Doxycycline Hyclate 200 MG TBEC Take by mouth.   Yes [provider]  ivermectin (STROMECTOL) 3 MG TABS tablet TK 7 TS PO AT ONCE AND REPEAT 1 WEEK LATER. WAIT 2 MONTHS TO REPEAT 11/24/17  Yes [provider]  SOOLANTRA 1 % CREA 1(ONE) APPLICATION(S) TOPICAL EVERY DAY 11/24/17  Yes [provider]  doxycycline (DORYX) 100 MG EC tablet 1(ONE) TABLET(S) ORAL 2(TWO) TIMES A DAY 12/02/18   [provider]    Physical Exam Vitals: Blood pressure 100/80, height 5\' 2"  (1.575 m), weight 169 lb 6.4 oz (76.8 kg), last menstrual period 12/12/2018.  General: NAD HEENT: normocephalic, anicteric Thyroid: no enlargement Pulmonary: No increased work of breathing, CTAB Cardiovascular: RRR, no murmurs, rubs, or gallops Breast: Breasts symmetrical, no tenderness, right breast with no palpable nodules or masses, left breast with mobile mass at around 2 o'clock, about 1.5 cm diameter, non-tender; no skin or nipple retraction present, no nipple discharge.  No axillary or supraclavicular lymphadenopathy. Abdomen: Soft, non-tender, non-distended.  Genitourinary: Deferred after shared decision-making, normal Pap/HPV last year and no symptoms Neurologic: Grossly intact Psychiatric: mood appropriate, affect full  Assessment: 48 y.o. [id number] annual exam  Plan: Problem List Items Addressed This Visit    None    Visit Diagnoses    Women's annual routine gynecological examination    -  Primary   Breast cancer screening by mammogram       Relevant Orders   MM DIGITAL SCREENING BILATERAL      1) Mammogram - recommend yearly screening mammogram.  Mammogram was  ordered today and she will call for an appointment in [place].  2) STI screening was offered and declined  3) ASCCP guidelines and rationale discussed.  Natasha Marshall opts for every 3 year screening interval  4) Contraception - Tubal ligation  5) Colonoscopy -- Discussed, wishes to start screening at age 14  6) Routine healthcare maintenance including cholesterol, diabetes screening discussed: Declines today.  Avel Sensor, CNM 01/21/2019  2:07 PM

## 2019-02-14 ENCOUNTER — Telehealth: Payer: Self-pay

## 2019-02-14 NOTE — Telephone Encounter (Signed)
Pt calling; states estrogen was discussed at last appt; sxs are worse now; would like rx for estrogen; [facility] [place].  [phone]

## 2019-02-15 ENCOUNTER — Other Ambulatory Visit: Payer: Self-pay | Admitting: Maternal Newborn

## 2019-02-15 DIAGNOSIS — N951 Menopausal and female climacteric states: Secondary | ICD-10-CM

## 2019-02-15 MED ORDER — LO LOESTRIN FE 1 MG-10 MCG / 10 MCG PO TABS: 1.0000 | ORAL_TABLET | Freq: Every day | ORAL | 3 refills | Status: DC

## 2019-02-15 NOTE — Telephone Encounter (Signed)
Patient is calling to follow up on message left. Patient aware JS was out of the office yesterday.

## 2019-02-15 NOTE — Progress Notes (Signed)
Discussed with patient, will try Lo Lo Estrin for increase in hot flashes with nausea/vertigo. No contraindications to therapy with combined OCP.

## 2019-12-08 DIAGNOSIS — L578 Other skin changes due to chronic exposure to nonionizing radiation: Secondary | ICD-10-CM | POA: Diagnosis not present

## 2019-12-08 DIAGNOSIS — L718 Other rosacea: Secondary | ICD-10-CM | POA: Diagnosis not present

## 2019-12-08 DIAGNOSIS — M6740 Ganglion, unspecified site: Secondary | ICD-10-CM | POA: Diagnosis not present

## 2020-01-16 ENCOUNTER — Telehealth: Payer: Self-pay

## 2020-01-16 DIAGNOSIS — N951 Menopausal and female climacteric states: Secondary | ICD-10-CM

## 2020-01-16 MED ORDER — LO LOESTRIN FE 1 MG-10 MCG / 10 MCG PO TABS: 1.0000 | ORAL_TABLET | Freq: Every day | ORAL | 0 refills | Status: DC

## 2020-01-16 NOTE — Telephone Encounter (Signed)
Pt calling; needs refill of bc to get to appt 9/10th; [facility] [place].  [phone]  Pt aware refill eRx'd.

## 2020-02-10 ENCOUNTER — Other Ambulatory Visit: Payer: Self-pay

## 2020-02-10 ENCOUNTER — Ambulatory Visit (INDEPENDENT_AMBULATORY_CARE_PROVIDER_SITE_OTHER): Payer: BC Managed Care – PPO | Admitting: Obstetrics and Gynecology

## 2020-02-10 ENCOUNTER — Encounter: Payer: Self-pay | Admitting: Obstetrics and Gynecology

## 2020-02-10 VITALS — BP 115/72 | Ht 62.0 in | Wt 170.0 lb

## 2020-02-10 DIAGNOSIS — Z01419 Encounter for gynecological examination (general) (routine) without abnormal findings: Secondary | ICD-10-CM

## 2020-02-10 DIAGNOSIS — Z131 Encounter for screening for diabetes mellitus: Secondary | ICD-10-CM

## 2020-02-10 DIAGNOSIS — Z1231 Encounter for screening mammogram for malignant neoplasm of breast: Secondary | ICD-10-CM | POA: Diagnosis not present

## 2020-02-10 DIAGNOSIS — Z1329 Encounter for screening for other suspected endocrine disorder: Secondary | ICD-10-CM

## 2020-02-10 DIAGNOSIS — Z1211 Encounter for screening for malignant neoplasm of colon: Secondary | ICD-10-CM

## 2020-02-10 DIAGNOSIS — Z0000 Encounter for general adult medical examination without abnormal findings: Secondary | ICD-10-CM

## 2020-02-10 DIAGNOSIS — Z1239 Encounter for other screening for malignant neoplasm of breast: Secondary | ICD-10-CM

## 2020-02-10 DIAGNOSIS — Z13220 Encounter for screening for lipoid disorders: Secondary | ICD-10-CM

## 2020-02-10 DIAGNOSIS — E28319 Asymptomatic premature menopause: Secondary | ICD-10-CM

## 2020-02-10 DIAGNOSIS — Z780 Asymptomatic menopausal state: Secondary | ICD-10-CM

## 2020-02-10 DIAGNOSIS — R232 Flushing: Secondary | ICD-10-CM

## 2020-02-10 DIAGNOSIS — N6315 Unspecified lump in the right breast, overlapping quadrants: Secondary | ICD-10-CM

## 2020-02-10 MED ORDER — PAROXETINE HCL 10 MG PO TABS: 10.0000 mg | ORAL_TABLET | Freq: Every day | ORAL | 11 refills | Status: DC

## 2020-02-10 NOTE — Patient Instructions (Signed)
Institute of Medicine Recommended Dietary Allowances for Calcium and Vitamin D  Age (yr) Calcium Recommended Dietary Allowance (mg/day) Vitamin D Recommended Dietary Allowance (international units/day)  9-18 1,300 600  19-50 1,000 600  51-70 1,200 600  71 and older 1,200 800  Data from [employer] of Medicine. Dietary reference intakes: calcium, vitamin D. [place], [place]: National Academies Press; 2011.    Exercising to Stay Healthy To become healthy and stay healthy, it is recommended that you do moderate-intensity and vigorous-intensity exercise. You can tell that you are exercising at a moderate intensity if your heart starts beating faster and you start breathing faster but can still hold a conversation. You can tell that you are exercising at a vigorous intensity if you are breathing much harder and faster and cannot hold a conversation while exercising. Exercising regularly is important. It has many health benefits, such as:  Improving overall fitness, flexibility, and endurance.  Increasing bone density.  Helping with weight control.  Decreasing body fat.  Increasing muscle strength.  Reducing stress and tension.  Improving overall health. How often should I exercise? Choose an activity that you enjoy, and set realistic goals. Your health care provider can help you make an activity plan that works for you. Exercise regularly as told by your health care provider. This may include:  Doing strength training two times a week, such as: ? Lifting weights. ? Using resistance bands. ? Push-ups. ? Sit-ups. ? Yoga.  Doing a certain intensity of exercise for a given amount of time. Choose from these options: ? A total of 150 minutes of moderate-intensity exercise every week. ? A total of 75 minutes of vigorous-intensity exercise every week. ? A mix of moderate-intensity and vigorous-intensity exercise every week. Children, pregnant women, people who have not exercised  regularly, people who are overweight, and older adults may need to talk with a health care provider about what activities are safe to do. If you have a medical condition, be sure to talk with your health care provider before you start a new exercise program. What are some exercise ideas? Moderate-intensity exercise ideas include:  Walking 1 mile (1.6 km) in about 15 minutes.  Biking.  Hiking.  Golfing.  Dancing.  Water aerobics. Vigorous-intensity exercise ideas include:  Walking 4.5 miles (7.2 km) or more in about 1 hour.  Jogging or running 5 miles (8 km) in about 1 hour.  Biking 10 miles (16.1 km) or more in about 1 hour.  Lap swimming.  Roller-skating or in-line skating.  Cross-country skiing.  Vigorous competitive sports, such as football, basketball, and soccer.  Jumping rope.  Aerobic dancing. What are some everyday activities that can help me to get exercise?  Yard work, such as: ? Pushing a lawn mower. ? Raking and bagging leaves.  Washing your car.  Pushing a stroller.  Shoveling snow.  Gardening.  Washing windows or floors. How can I be more active in my day-to-day activities?  Use stairs instead of an elevator.  Take a walk during your lunch break.  If you drive, park your car farther away from your work or school.  If you take public transportation, get off one stop early and walk the rest of the way.  Stand up or walk around during all of your indoor phone calls.  Get up, stretch, and walk around every 30 minutes throughout the day.  Enjoy exercise with a friend. Support to continue exercising will help you keep a regular routine of activity. What guidelines can I   follow while exercising?  Before you start a new exercise program, talk with your health care provider.  Do not exercise so much that you hurt yourself, feel dizzy, or get very short of breath.  Wear comfortable clothes and wear shoes with good support.  Drink plenty of  water while you exercise to prevent dehydration or heat stroke.  Work out until your breathing and your heartbeat get faster. Where to find more information  [place] Department of Health and Human Services: www.hhs.gov  Centers for Disease Control and Prevention (CDC): www.cdc.gov Summary  Exercising regularly is important. It will improve your overall fitness, flexibility, and endurance.  Regular exercise also will improve your overall health. It can help you control your weight, reduce stress, and improve your bone density.  Do not exercise so much that you hurt yourself, feel dizzy, or get very short of breath.  Before you start a new exercise program, talk with your health care provider. This information is not intended to replace advice given to you by your health care provider. Make sure you discuss any questions you have with your health care provider. Document Revised: 05/01/2017 Document Reviewed: 04/09/2017 Elsevier Patient Education  2020 Elsevier Inc.   Budget-Friendly Healthy Eating There are many ways to save money at the grocery store and continue to eat healthy. You can be successful if you:  Plan meals according to your budget.  Make a grocery list and only purchase food according to your grocery list.  Prepare food yourself. What are tips for following this plan?  Reading food labels  Compare food labels between brand name foods and the store brand. Often the nutritional value is the same, but the store brand is lower cost.  Look for products that do not have added sugar, fat, or salt (sodium). These often cost the same but are healthier for you. Products may be labeled as: ? Sugar-free. ? Nonfat. ? Low-fat. ? Sodium-free. ? Low-sodium.  Look for lean ground beef labeled as at least 92% lean and 8% fat. Shopping  Buy only the items on your grocery list and go only to the areas of the store that have the items on your list.  Use coupons only for foods  and brands you normally buy. Avoid buying items you wouldn't normally buy simply because they are on sale.  Check online and in newspapers for weekly deals.  Buy healthy items from the bulk bins when available, such as herbs, spices, flour, pasta, nuts, and dried fruit.  Buy fruits and vegetables that are in season. Prices are usually lower on in-season produce.  Look at the unit price on the price tag. Use it to compare different brands and sizes to find out which item is the best deal.  Choose healthy items that are often low-cost, such as carrots, potatoes, apples, bananas, and oranges. Dried or canned beans are a low-cost protein source.  Buy in bulk and freeze extra food. Items you can buy in bulk include meats, fish, poultry, frozen fruits, and frozen vegetables.  Avoid buying "ready-to-eat" foods, such as pre-cut fruits and vegetables and pre-made salads.  If possible, shop around to discover where you can find the best prices. Consider other retailers such as dollar stores, larger wholesale stores, local fruit and vegetable stands, and farmers markets.  Do not shop when you are hungry. If you shop while hungry, it may be hard to stick to your list and budget.  Resist impulse buying. Use your grocery list as your   official plan for the week.  Buy a variety of vegetables and fruits by purchasing fresh, frozen, and canned items.  Look at the top and bottom shelves for deals. Foods at eye level (eye level of an adult or child) are usually more expensive.  Be efficient with your time when shopping. The more time you spend at the store, the more money you are likely to spend.  To save money when choosing more expensive foods like meats and dairy: ? Choose cheaper cuts of meat, such as bone-in chicken thighs and drumsticks instead of skinless and boneless chicken. When you are ready to prepare the chicken, you can remove the skin yourself to make it healthier. ? Choose lean meats like  chicken or [place] instead of beef. ? Choose canned seafood, such as tuna, salmon, or sardines. ? Buy eggs as a low-cost source of protein. ? Buy dried beans and peas, such as lentils, split peas, or kidney beans instead of meats. Dried beans and peas are a good alternative source of protein. ? Buy the larger tubs of yogurt instead of individual-sized containers.  Choose water instead of sodas and other sweetened beverages.  Avoid buying chips, cookies, and other "junk food." These items are usually expensive and not healthy. Cooking  Make extra food and freeze the extras in meal-sized containers or in individual portions for fast meals and snacks.  Pre-cook on days when you have extra time to prepare meals in advance. You can keep these meals in the fridge or freezer and reheat for a quick meal.  When you come home from the grocery store, wash, peel, and cut fruits and vegetables so they are ready to use and eat. This will help reduce food waste. Meal planning  Do not eat out or get fast food. Prepare food at home.  Make a grocery list and make sure to bring it with you to the store. If you have a smart phone, you could use your phone to create your shopping list.  Plan meals and snacks according to a grocery list and budget you create.  Use leftovers in your meal plan for the week.  Look for recipes where you can cook once and make enough food for two meals.  Include budget-friendly meals like stews, casseroles, and stir-fry dishes.  Try some meatless meals or try "no cook" meals like salads.  Make sure that half your plate is filled with fruits or vegetables. Choose from fresh, frozen, or canned fruits and vegetables. If eating canned, remember to rinse them before eating. This will remove any excess salt added for packaging. Summary  Eating healthy on a budget is possible if you plan your meals according to your budget, purchase according to your budget and grocery list, and  prepare food yourself.  Tips for buying more food on a limited budget include buying generic brands, using coupons only for foods you normally buy, and buying healthy items from the bulk bins when available.  Tips for buying cheaper food to replace expensive food include choosing cheaper, lean cuts of meat, and buying dried beans and peas. This information is not intended to replace advice given to you by your health care provider. Make sure you discuss any questions you have with your health care provider. Document Revised: 05/20/2017 Document Reviewed: 05/20/2017 Elsevier Patient Education  2020 [place] protect organs, store calcium, anchor muscles, and support the whole body. Keeping your bones strong is important, especially as you get  older. You can take actions to help keep your bones strong and healthy. Why is keeping my bones healthy important?  Keeping your bones healthy is important because your body constantly replaces bone cells. Cells get old, and new cells take their place. As we age, we lose bone cells because the body may not be able to make enough new cells to replace the old cells. The amount of bone cells and bone tissue you have is referred to as bone mass. The higher your bone mass, the stronger your bones. The aging process leads to an overall loss of bone mass in the body, which can increase the likelihood of:  Joint pain and stiffness.  Broken bones.  A condition in which the bones become weak and brittle (osteoporosis). A large decline in bone mass occurs in older adults. In women, it occurs about the time of menopause. What actions can I take to keep my bones healthy? Good health habits are important for maintaining healthy bones. This includes eating nutritious foods and exercising regularly. To have healthy bones, you need to get enough of the right minerals and vitamins. Most nutrition experts recommend getting these nutrients from the  foods that you eat. In some cases, taking supplements may also be recommended. Doing certain types of exercise is also important for bone health. What are the nutritional recommendations for healthy bones?  Eating a well-balanced diet with plenty of calcium and vitamin D will help to protect your bones. Nutritional recommendations vary from person to person. Ask your health care provider what is healthy for you. Here are some general guidelines. Get enough calcium Calcium is the most important (essential) mineral for bone health. Most people can get enough calcium from their diet, but supplements may be recommended for people who are at risk for osteoporosis. Good sources of calcium include:  Dairy products, such as low-fat or nonfat milk, cheese, and yogurt.  Dark green leafy vegetables, such as bok choy and broccoli.  Calcium-fortified foods, such as orange juice, cereal, bread, soy beverages, and tofu products.  Nuts, such as almonds. Follow these recommended amounts for daily calcium intake:  Children, age 77-3: 700 mg.  Children, age 35-8: 1,000 mg.  Children, age 84-13: 1,300 mg.  Teens, age 79-18: 1,300 mg.  Adults, age 64-50: 1,000 mg.  Adults, age 62-70: ? Men: 1,000 mg. ? Women: 1,200 mg.  Adults, age 50 or older: 1,200 mg.  Pregnant and breastfeeding females: ? Teens: 1,300 mg. ? Adults: 1,000 mg. Get enough vitamin D Vitamin D is the most essential vitamin for bone health. It helps the body absorb calcium. Sunlight stimulates the skin to make vitamin D, so be sure to get enough sunlight. If you live in a cold climate or you do not get outside often, your health care provider may recommend that you take vitamin D supplements. Good sources of vitamin D in your diet include:  Egg yolks.  Saltwater fish.  Milk and cereal fortified with vitamin D. Follow these recommended amounts for daily vitamin D intake:  Children and teens, age 77-18: 600 international  units.  Adults, age 27 or younger: 400-800 international units.  Adults, age 51 or older: 800-1,000 international units. Get other important nutrients Other nutrients that are important for bone health include:  Phosphorus. This mineral is found in meat, poultry, dairy foods, nuts, and legumes. The recommended daily intake for adult men and adult women is 700 mg.  Magnesium. This mineral is found in seeds, nuts, dark green  vegetables, and legumes. The recommended daily intake for adult men is 400-420 mg. For adult women, it is 310-320 mg.  Vitamin K. This vitamin is found in green leafy vegetables. The recommended daily intake is 120 mg for adult men and 90 mg for adult women. What type of physical activity is best for building and maintaining healthy bones? Weight-bearing and strength-building activities are important for building and maintaining healthy bones. Weight-bearing activities cause muscles and bones to work against gravity. Strength-building activities increase the strength of the muscles that support bones. Weight-bearing and muscle-building activities include:  Walking and hiking.  Jogging and running.  Dancing.  Gym exercises.  Lifting weights.  Tennis and racquetball.  Climbing stairs.  Aerobics. Adults should get at least 30 minutes of moderate physical activity on most days. Children should get at least 60 minutes of moderate physical activity on most days. Ask your health care provider what type of exercise is best for you. How can I find out if my bone mass is low? Bone mass can be measured with an X-ray test called a bone mineral density (BMD) test. This test is recommended for all women who are age 19 or older. It may also be recommended for:  Men who are age 62 or older.  People who are at risk for osteoporosis because of: ? Having bones that break easily. ? Having a long-term disease that weakens bones, such as kidney disease or rheumatoid  arthritis. ? Having menopause earlier than normal. ? Taking medicine that weakens bones, such as steroids, thyroid hormones, or hormone treatment for breast cancer or prostate cancer. ? Smoking. ? Drinking three or more alcoholic drinks a day. If you find that you have a low bone mass, you may be able to prevent osteoporosis or further bone loss by changing your diet and lifestyle. Where can I find more information? For more information, check out the following websites:  National Osteoporosis Foundation: https://carlson-fletcher.info/  [employer]: www.bones.http://www.myers.net/  International Osteoporosis Foundation: [employer], operational.iofbonehealth.org Summary  The aging process leads to an overall loss of bone mass in the body, which can increase the likelihood of broken bones and osteoporosis.  Eating a well-balanced diet with plenty of calcium and vitamin D will help to protect your bones.  Weight-bearing and strength-building activities are also important for building and maintaining strong bones.  Bone mass can be measured with an X-ray test called a bone mineral density (BMD) test. This information is not intended to replace advice given to you by your health care provider. Make sure you discuss any questions you have with your health care provider. Document Revised: 06/15/2017 Document Reviewed: 06/15/2017 Elsevier Patient Education  2020 Elsevier Inc.   Menopause and Hormone Replacement Therapy Menopause is a normal time of life when menstrual periods stop completely and the ovaries stop producing the female hormones estrogen and progesterone. This lack of hormones can affect your health and cause undesirable symptoms. Hormone replacement therapy (HRT) can relieve some of those symptoms. What is hormone replacement therapy? HRT is the use of artificial (synthetic) hormones to replace hormones that your body has stopped producing because you have reached menopause. What are my options for  HRT?  HRT may consist of the synthetic hormones estrogen and progestin, or it may consist of only estrogen (estrogen-only therapy). You and your health care provider will decide which form of HRT is best for you. If you choose to be on HRT and you have a uterus, estrogen and progestin are usually  prescribed. Estrogen-only therapy is used for women who do not have a uterus. Possible options for taking HRT include:  Pills.  Patches.  Gels.  Sprays.  Vaginal cream.  Vaginal rings.  Vaginal inserts. The amount of hormone(s) that you take and how long you take the hormone(s) varies according to your health. It is important to:  Begin HRT with the lowest possible dosage.  Stop HRT as soon as your health care provider tells you to stop.  Work with your health care provider so that you feel informed and comfortable with your decisions. What are the benefits of HRT? HRT can reduce the frequency and severity of menopausal symptoms. Benefits of HRT vary according to the kind of symptoms that you have, how severe they are, and your overall health. HRT may help to improve the following symptoms of menopause:  Hot flashes and night sweats. These are sudden feelings of heat that spread over the face and body. The skin may turn red, like a blush. Night sweats are hot flashes that happen while you are sleeping or trying to sleep.  Bone loss (osteoporosis). The body loses calcium more quickly after menopause, causing the bones to become weaker. This can increase the risk for bone breaks (fractures).  Vaginal dryness. The lining of the vagina can become thin and dry, which can cause pain during sex or cause infection, burning, or itching.  Urinary tract infections.  Urinary incontinence. This is the inability to control when you pass urine.  Irritability.  Short-term memory problems. What are the risks of HRT? Risks of HRT vary depending on your individual health and medical history. Risks of  HRT also depend on whether you receive both estrogen and progestin or you receive estrogen only. HRT may increase the risk of:  Spotting. This is when a small amount of blood leaks from the vagina unexpectedly.  Endometrial cancer. This cancer is in the lining of the uterus (endometrium).  Breast cancer.  Increased density of breast tissue. This can make it harder to find breast cancer on a breast X-ray (mammogram).  Stroke.  Heart disease.  Blood clots.  Gallbladder disease.  Liver disease. Risks of HRT can increase if you have any of the following conditions:  Endometrial cancer.  Liver disease.  Heart disease.  Breast cancer.  History of blood clots.  History of stroke. Follow these instructions at home:  Take over-the-counter and prescription medicines only as told by your health care provider.  Get mammograms, pelvic exams, and medical checkups as often as told by your health care provider.  Have Pap tests done as often as told by your health care provider. A Pap test is sometimes called a Pap smear. It is a screening test that is used to check for signs of cancer of the cervix and vagina. A Pap test can also identify the presence of infection or precancerous changes. Pap tests may be done: ? Every 3 years, starting at age 16. ? Every 5 years, starting after age 32, in combination with testing for human papillomavirus (HPV). ? More often or less often depending on other medical conditions you have, your age, and other risk factors.  It is up to you to get the results of your Pap test. Ask your health care provider, or the department that is doing the test, when your results will be ready.  Keep all follow-up visits as told by your health care provider. This is important. Contact a health care provider if you have:  Pain or swelling in your legs.  Shortness of breath.  Chest pain.  Lumps or changes in your breasts or armpits.  Slurred speech.  Pain,  burning, or bleeding when you urinate.  Unusual vaginal bleeding.  Dizziness or headaches.  Weakness or numbness in any part of your arms or legs.  Pain in your abdomen. Summary  Menopause is a normal time of life when menstrual periods stop completely and the ovaries stop producing the female hormones estrogen and progesterone.  Hormone replacement therapy (HRT) can relieve some of the symptoms of menopause.  HRT can reduce the frequency and severity of menopausal symptoms.  Risks of HRT vary depending on your individual health and medical history. This information is not intended to replace advice given to you by your health care provider. Make sure you discuss any questions you have with your health care provider. Document Revised: 01/19/2018 Document Reviewed: 01/19/2018 Elsevier Patient Education  2020 [employer].

## 2020-02-10 NOTE — Progress Notes (Signed)
Gynecology Annual Exam  PCP: Patient, No Pcp Per  Chief Complaint:  Chief Complaint  Patient presents with  . Gynecologic Exam    annual exam    History of Present Illness: Patient is a 49 y.o. [id number] presents for annual exam. The patient has no complaints today.   LMP: Patient's last menstrual period was 01/30/2020. Average Interval:irregular Duration of flow: 0 days Reports maybe 4 periods a year, generally very light, taking monthly birth control but does not have bleeding when expected to on the hormone free week.  Has been having hot flashes and sleep discturbances. She reports a family history of early menopause.   The patient is sexually active. She currently uses OCP (estrogen/progesterone) for contraception. She denies dyspareunia.  The patient does perform self breast exams.  There is notable family history of breast or ovarian cancer in her family.  The patient has regular exercise: no, but motivated to start again.    The patient denies current symptoms of depression.    Review of Systems: Review of Systems  Constitutional: Negative for chills, fever, malaise/fatigue and weight loss.  HENT: Negative for congestion, hearing loss and sinus pain.   Eyes: Negative for blurred vision and double vision.  Respiratory: Negative for cough, sputum production, shortness of breath and wheezing.   Cardiovascular: Negative for chest pain, palpitations, orthopnea and leg swelling.  Gastrointestinal: Negative for abdominal pain, constipation, diarrhea, nausea and vomiting.  Genitourinary: Negative for dysuria, flank pain, frequency, hematuria and urgency.  Musculoskeletal: Negative for back pain, falls and joint pain.  Skin: Negative for itching and rash.  Neurological: Negative for dizziness and headaches.  Psychiatric/Behavioral: Negative for depression, substance abuse and suicidal ideas. The patient is not nervous/anxious.     Past Medical History:  There are no problems  to display for this patient.   Past Surgical History:  Past Surgical History:  Procedure Laterality Date  . TONSILLECTOMY  1992  . TUBAL LIGATION  2004  . TUBAL LIGATION      Gynecologic History:  Patient's last menstrual period was 01/30/2020. Contraception: OCP (estrogen/progesterone) Last Pap: Results were: 11/27/2017 NIL and HR HPV negative  Last mammogram: 2918 Results were: BI-RAD I  Obstetric History: [id number]  Family History:  Family History  Problem Relation Age of Onset  . Basal cell carcinoma Maternal Grandmother 70       skin ca  . Colon cancer Maternal Grandmother 49  . COPD Maternal Grandmother   . Coronary artery disease Maternal Grandmother 54  . Stroke Maternal Grandmother   . Cancer Maternal Grandmother 49       uterine   . Breast cancer Neg Hx     Social History:  Social History   Socioeconomic History  . Marital status: Divorced    Spouse name: Not on file  . Number of children: 2  . Years of education: 66  . Highest education level: Not on file  Occupational History  . Not on file  Tobacco Use  . Smoking status: Former Games developer  . Smokeless tobacco: Never Used  Vaping Use  . Vaping Use: Never used  Substance and Sexual Activity  . Alcohol use: Yes    Comment: social  . Drug use: No  . Sexual activity: Yes    Birth control/protection: Surgical    Comment: Tubal ligation   Other Topics Concern  . Not on file  Social History Narrative  . Not on file   Social Determinants of Health   Financial  Resource Strain:   . Difficulty of Paying Living Expenses: Not on file  Food Insecurity:   . Worried About Programme researcher, broadcasting/film/video in the Last Year: Not on file  . Ran Out of Food in the Last Year: Not on file  Transportation Needs:   . Lack of Transportation (Medical): Not on file  . Lack of Transportation (Non-Medical): Not on file  Physical Activity:   . Days of Exercise per Week: Not on file  . Minutes of Exercise per Session: Not on file    Stress:   . Feeling of Stress : Not on file  Social Connections:   . Frequency of Communication with Friends and Family: Not on file  . Frequency of Social Gatherings with Friends and Family: Not on file  . Attends Religious Services: Not on file  . Active Member of Clubs or Organizations: Not on file  . Attends Banker Meetings: Not on file  . Marital Status: Not on file  Intimate Partner Violence:   . Fear of Current or Ex-Partner: Not on file  . Emotionally Abused: Not on file  . Physically Abused: Not on file  . Sexually Abused: Not on file    Allergies:  No Known Allergies  Medications: Prior to Admission medications   Medication Sig Start Date End Date Taking? Authorizing Provider  doxycycline (DORYX) 100 MG EC tablet 1(ONE) TABLET(S) ORAL 2(TWO) TIMES A DAY 12/02/18   [provider]  doxycycline (VIBRAMYCIN) 100 MG capsule Take 100 mg by mouth 2 (two) times daily. 01/16/20   [provider]  ivermectin (STROMECTOL) 3 MG TABS tablet TK 7 TS PO AT ONCE AND REPEAT 1 WEEK LATER. WAIT 2 MONTHS TO REPEAT 11/24/17   [provider]  Norethindrone-Ethinyl Estradiol-Fe Biphas (LO LOESTRIN FE) 1 MG-10 MCG / 10 MCG tablet Take 1 tablet by mouth daily. 01/16/20 04/09/20  Gabriellah Rabel, Jaquelyn Bitter, MD  PARoxetine (PAXIL) 10 MG tablet Take 1 tablet (10 mg total) by mouth daily. 02/10/20 02/09/21  Natale Milch, MD  SOOLANTRA 1 % CREA 1(ONE) APPLICATION(S) TOPICAL EVERY DAY 11/24/17   [provider]    Physical Exam Vitals: Blood pressure 115/72, height 5\' 2"  (1.575 m), weight 170 lb (77.1 kg), last menstrual period 01/30/2020.  General: NAD HEENT: normocephalic, anicteric Thyroid: no enlargement, no palpable nodules Pulmonary: No increased work of breathing, CTAB Cardiovascular: RRR, distal pulses 2+ Breast: Breast symmetrical, no tenderness, right breast 3cm palpable lump at 12 o'clock, no skin or nipple retraction present, no nipple  discharge.  No axillary or supraclavicular lymphadenopathy. Abdomen: NABS, soft, non-tender, non-distended.  Umbilicus without lesions.  No hepatomegaly, splenomegaly or masses palpable. No evidence of hernia  Genitourinary:  External: Normal external female genitalia.  Normal urethral meatus, normal Bartholin's and Skene's glands.    Vagina: Normal vaginal mucosa, no evidence of prolapse.    Cervix: Grossly normal in appearance, no bleeding  Uterus: Non-enlarged, mobile, normal contour.  No CMT  Adnexa: ovaries non-enlarged, no adnexal masses  Rectal: deferred  Lymphatic: no evidence of inguinal lymphadenopathy Extremities: no edema, erythema, or tenderness Neurologic: Grossly intact Psychiatric: mood appropriate, affect full  Female chaperone present for pelvic and breast  portions of the physical exam    Assessment: 49 y.o. 51 routine annual exam  Plan: Problem List Items Addressed This Visit    None    Visit Diagnoses    Encounter for annual routine gynecological examination    -  Primary   Health maintenance  examination       Relevant Orders   CBC   Comprehensive metabolic panel   Breast cancer screening by mammogram       Screening for thyroid disorder       Relevant Orders   TSH   Screening for diabetes mellitus       Relevant Orders   Comprehensive metabolic panel   Colon cancer screening       Relevant Orders   Ambulatory referral to Gastroenterology   Screening cholesterol level       Relevant Orders   Lipid panel   Encounter for screening breast examination       Encounter for gynecological examination without abnormal finding       Breast lump on right side at 12 o'clock position       Relevant Orders   MM DIAG BREAST TOMO BILATERAL   US BREAST LTD UNI RIGHT INC AXILLA   Premature menopause       Relevant Orders   FSH   Estradiol   Menopause       Relevant Orders   FSH   Estradiol   Hot flashes       Relevant Medications   PARoxetine (PAXIL)  10 MG tablet   Other Relevant Orders   FSH   Estradiol      1) Breast lump- diagnostic mammogram ordered  2) STI screening  was offered and declined  3) ASCCP guidelines and rational discussed.  Patient opts for every 3 years screening interval  4) Colonoscopy -referral placed. May have difficulty with transportation  5) Routine healthcare maintenance including cholesterol, diabetes screening discussed managed by PCP  6) Will follow up in 2-3 months for testing for menopause and fasting labs, consider hormone replacment therapy. Will start Paxil for reported hot flashes.   7)  Return in about 2 months (around 04/11/2020) for 2-33 months for labs and GYN visit.   Natale Milch [facility], [facility] 02/10/2020, 2:53 PM

## 2020-02-21 ENCOUNTER — Encounter: Payer: Self-pay | Admitting: *Deleted

## 2020-02-21 ENCOUNTER — Other Ambulatory Visit: Payer: BLUE CROSS/BLUE SHIELD

## 2020-03-09 ENCOUNTER — Other Ambulatory Visit: Payer: Self-pay

## 2020-03-09 ENCOUNTER — Ambulatory Visit
Admission: RE | Admit: 2020-03-09 | Discharge: 2020-03-09 | Disposition: A | Discharge status: Home / Self Care | Payer: BC Managed Care – PPO | Source: Ambulatory Visit | Attending: Obstetrics and Gynecology | Admitting: Obstetrics and Gynecology

## 2020-03-09 DIAGNOSIS — N6315 Unspecified lump in the right breast, overlapping quadrants: Secondary | ICD-10-CM | POA: Diagnosis not present

## 2020-03-09 DIAGNOSIS — R922 Inconclusive mammogram: Secondary | ICD-10-CM | POA: Diagnosis not present

## 2020-03-09 DIAGNOSIS — N6001 Solitary cyst of right breast: Secondary | ICD-10-CM | POA: Diagnosis not present

## 2020-04-02 ENCOUNTER — Other Ambulatory Visit: Payer: Self-pay | Admitting: Obstetrics and Gynecology

## 2020-04-02 DIAGNOSIS — N951 Menopausal and female climacteric states: Secondary | ICD-10-CM

## 2020-04-16 ENCOUNTER — Other Ambulatory Visit: Payer: BC Managed Care – PPO

## 2020-04-16 ENCOUNTER — Other Ambulatory Visit: Payer: Self-pay

## 2020-04-16 DIAGNOSIS — R232 Flushing: Secondary | ICD-10-CM

## 2020-04-16 DIAGNOSIS — Z13220 Encounter for screening for lipoid disorders: Secondary | ICD-10-CM

## 2020-04-16 DIAGNOSIS — Z0000 Encounter for general adult medical examination without abnormal findings: Secondary | ICD-10-CM

## 2020-04-16 DIAGNOSIS — Z131 Encounter for screening for diabetes mellitus: Secondary | ICD-10-CM

## 2020-04-16 DIAGNOSIS — Z1329 Encounter for screening for other suspected endocrine disorder: Secondary | ICD-10-CM

## 2020-04-16 DIAGNOSIS — Z780 Asymptomatic menopausal state: Secondary | ICD-10-CM

## 2020-04-16 DIAGNOSIS — E28319 Asymptomatic premature menopause: Secondary | ICD-10-CM

## 2020-04-18 LAB — COMPREHENSIVE METABOLIC PANEL

## 2020-04-18 LAB — CBC

## 2020-04-18 LAB — TSH

## 2020-04-18 LAB — LIPID PANEL

## 2020-04-18 LAB — ESTRADIOL

## 2020-04-19 ENCOUNTER — Other Ambulatory Visit: Payer: BC Managed Care – PPO

## 2020-04-19 ENCOUNTER — Other Ambulatory Visit: Payer: Self-pay | Admitting: Obstetrics and Gynecology

## 2020-04-24 ENCOUNTER — Other Ambulatory Visit: Payer: Self-pay

## 2020-04-24 ENCOUNTER — Ambulatory Visit: Payer: BC Managed Care – PPO | Admitting: Obstetrics and Gynecology

## 2020-04-24 ENCOUNTER — Other Ambulatory Visit: Payer: BC Managed Care – PPO

## 2020-04-24 ENCOUNTER — Other Ambulatory Visit: Payer: Self-pay | Admitting: Obstetrics and Gynecology

## 2020-04-24 DIAGNOSIS — R232 Flushing: Secondary | ICD-10-CM

## 2020-04-24 DIAGNOSIS — Z0000 Encounter for general adult medical examination without abnormal findings: Secondary | ICD-10-CM | POA: Diagnosis not present

## 2020-04-24 DIAGNOSIS — Z130 Encounter for screening for diseases of the blood and blood-forming organs and certain disorders involving the immune mechanism: Secondary | ICD-10-CM | POA: Diagnosis not present

## 2020-04-24 DIAGNOSIS — Z131 Encounter for screening for diabetes mellitus: Secondary | ICD-10-CM

## 2020-04-24 DIAGNOSIS — Z1329 Encounter for screening for other suspected endocrine disorder: Secondary | ICD-10-CM | POA: Diagnosis not present

## 2020-04-24 DIAGNOSIS — Z13220 Encounter for screening for lipoid disorders: Secondary | ICD-10-CM

## 2020-04-25 LAB — COMPREHENSIVE METABOLIC PANEL
ALT: 18 IU/L (ref 0–32)
AST: 17 IU/L (ref 0–40)
Albumin/Globulin Ratio: 2.1 (ref 1.2–2.2)
Albumin: 4.4 g/dL (ref 3.8–4.8)
Alkaline Phosphatase: 71 IU/L (ref 44–121)
BUN/Creatinine Ratio: 13 (ref 9–23)
BUN: 11 mg/dL (ref 6–24)
Bilirubin Total: 0.4 mg/dL (ref 0.0–1.2)
CO2: 24 mmol/L (ref 20–29)
Calcium: 9.1 mg/dL (ref 8.7–10.2)
Chloride: 103 mmol/L (ref 96–106)
Creatinine, Ser: 0.82 mg/dL (ref 0.57–1.00)
GFR calc Af Amer: 99 mL/min/{1.73_m2} (ref >59)
GFR calc non Af Amer: 85 mL/min/{1.73_m2} (ref >59)
Globulin, Total: 2.1 g/dL (ref 1.5–4.5)
Glucose: 98 mg/dL (ref 65–99)
Potassium: 4.1 mmol/L (ref 3.5–5.2)
Sodium: 140 mmol/L (ref 134–144)
Total Protein: 6.5 g/dL (ref 6.0–8.5)

## 2020-04-25 LAB — CBC
Hematocrit: 38.2 % (ref 34.0–46.6)
Hemoglobin: 12.7 g/dL (ref 11.1–15.9)
MCH: 30.9 pg (ref 26.6–33.0)
MCHC: 33.2 g/dL (ref 31.5–35.7)
MCV: 93 fL (ref 79–97)
Platelets: 352 10*3/uL (ref 150–450)
RBC: 4.11 x10E6/uL (ref 3.77–5.28)
RDW: 12.3 % (ref 11.7–15.4)
WBC: 6.8 10*3/uL (ref 3.4–10.8)

## 2020-04-25 LAB — TSH: TSH: 4.42 u[IU]/mL (ref 0.450–4.500)

## 2020-04-25 LAB — ESTRADIOL: Estradiol: 123 pg/mL

## 2020-04-25 LAB — FOLLICLE STIMULATING HORMONE: FSH: 26.1 m[IU]/mL

## 2020-05-02 ENCOUNTER — Telehealth (INDEPENDENT_AMBULATORY_CARE_PROVIDER_SITE_OTHER): Payer: Self-pay | Admitting: Gastroenterology

## 2020-05-02 ENCOUNTER — Other Ambulatory Visit: Payer: Self-pay

## 2020-05-02 DIAGNOSIS — Z1211 Encounter for screening for malignant neoplasm of colon: Secondary | ICD-10-CM

## 2020-05-02 MED ORDER — NA SULFATE-K SULFATE-MG SULF 17.5-3.13-1.6 GM/177ML PO SOLN: 1.0000 | Freq: Once | ORAL | 0 refills | Status: AC

## 2020-05-02 NOTE — Progress Notes (Signed)
Gastroenterology Pre-Procedure Review  Request Date: Friday 06/15/20 Requesting Physician: Dr. Servando Snare  PATIENT REVIEW QUESTIONS: The patient responded to the following health history questions as indicated:    1. Are you having any GI issues? no 2. Do you have a personal history of Polyps? no 3. Do you have a family history of Colon Cancer or Polyps? yes (maternal grandmother colon cancer, first cousin colon cancer) 4. Diabetes Mellitus? no 5. Joint replacements in the past 12 months?no 6. Major health problems in the past 3 months?no 7. Any artificial heart valves, MVP, or defibrillator?no    MEDICATIONS & ALLERGIES:    Patient reports the following regarding taking any anticoagulation/antiplatelet therapy:   Plavix, Coumadin, Eliquis, Xarelto, Lovenox, Pradaxa, Brilinta, or Effient? no Aspirin? no  Patient confirms/reports the following medications:  Current Outpatient Medications  Medication Sig Dispense Refill  . doxycycline (VIBRAMYCIN) 100 MG capsule Take 100 mg by mouth 2 (two) times daily.    Marland Kitchen ivermectin (STROMECTOL) 3 MG TABS tablet TK 7 TS PO AT ONCE AND REPEAT 1 WEEK LATER. WAIT 2 MONTHS TO REPEAT  5  . Multiple Vitamins-Minerals (WOMENS ONE DAILY) TABS Take by mouth.    Marland Kitchen omeprazole (PRILOSEC) 20 MG capsule Take 20 mg by mouth daily.    Marland Kitchen PARoxetine (PAXIL) 10 MG tablet Take 1 tablet (10 mg total) by mouth daily. 30 tablet 11  . SOOLANTRA 1 % CREA 1(ONE) APPLICATION(S) TOPICAL EVERY DAY  5  . doxycycline (DORYX) 100 MG EC tablet 1(ONE) TABLET(S) ORAL 2(TWO) TIMES A DAY (Patient not taking: Reported on 05/02/2020)    . Norethindrone-Ethinyl Estradiol-Fe Biphas (LO LOESTRIN FE) 1 MG-10 MCG / 10 MCG tablet Take 1 tablet by mouth daily. 84 tablet 0   No current facility-administered medications for this visit.    Patient confirms/reports the following allergies:  No Known Allergies  No orders of the defined types were placed in this encounter.   AUTHORIZATION  INFORMATION Primary Insurance: 1D#: Group #:  Secondary Insurance: 1D#: Group #:  SCHEDULE INFORMATION: Date: 06/15/20 Time: Location:[facility]

## 2020-05-03 NOTE — Telephone Encounter (Signed)
Patient is scheduled for 12/15/521 with [facility]

## 2020-05-03 NOTE — Telephone Encounter (Signed)
Can you reschedule?

## 2020-05-06 ENCOUNTER — Other Ambulatory Visit: Payer: Self-pay | Admitting: Obstetrics and Gynecology

## 2020-05-06 DIAGNOSIS — R232 Flushing: Secondary | ICD-10-CM

## 2020-05-07 ENCOUNTER — Ambulatory Visit: Payer: BC Managed Care – PPO | Admitting: Obstetrics and Gynecology

## 2020-05-16 ENCOUNTER — Encounter: Payer: Self-pay | Admitting: Obstetrics and Gynecology

## 2020-05-16 ENCOUNTER — Other Ambulatory Visit: Payer: Self-pay

## 2020-05-16 ENCOUNTER — Ambulatory Visit (INDEPENDENT_AMBULATORY_CARE_PROVIDER_SITE_OTHER): Payer: BC Managed Care – PPO | Admitting: Obstetrics and Gynecology

## 2020-05-16 VITALS — BP 114/70 | Ht 63.0 in | Wt 177.0 lb

## 2020-05-16 DIAGNOSIS — R7989 Other specified abnormal findings of blood chemistry: Secondary | ICD-10-CM

## 2020-05-16 NOTE — Progress Notes (Signed)
Lab results

## 2020-05-16 NOTE — Progress Notes (Signed)
Patient ID: Natasha Marshall, female   DOB: May 05, 1971, 49 y.o.   MRN: [id number]  Reason for Consult: Follow-up (Lab work)   Referred by No ref. provider found  Subjective:     HPI:  Natasha Marshall is a 49 y.o. female. She is following up to review her lab results.  She started Paxil and reports it is helping with her hot flashes. She feels that she recovers from the hot flashes easier.   Past Medical History:  Diagnosis Date  . Anxiety   . Breast mass, left    benign  . Depression   . Optical Rosacea    Family History  Problem Relation Age of Onset  . Basal cell carcinoma Maternal Grandmother 70       skin ca  . Colon cancer Maternal Grandmother 19  . COPD Maternal Grandmother   . Coronary artery disease Maternal Grandmother 54  . Stroke Maternal Grandmother   . Cancer Maternal Grandmother 49       uterine   . Breast cancer Neg Hx    Past Surgical History:  Procedure Laterality Date  . TONSILLECTOMY  1992  . TUBAL LIGATION  2004  . TUBAL LIGATION      Short Social History:  Social History   Tobacco Use  . Smoking status: Former Games developer  . Smokeless tobacco: Never Used  Substance Use Topics  . Alcohol use: Yes    Comment: social    No Known Allergies  Current Outpatient Medications  Medication Sig Dispense Refill  . doxycycline (DORYX) 100 MG EC tablet 1(ONE) TABLET(S) ORAL 2(TWO) TIMES A DAY (Patient not taking: Reported on 05/02/2020)    . doxycycline (VIBRAMYCIN) 100 MG capsule Take 100 mg by mouth 2 (two) times daily.    Marland Kitchen ivermectin (STROMECTOL) 3 MG TABS tablet TK 7 TS PO AT ONCE AND REPEAT 1 WEEK LATER. WAIT 2 MONTHS TO REPEAT  5  . Multiple Vitamins-Minerals (WOMENS ONE DAILY) TABS Take by mouth.    . Norethindrone-Ethinyl Estradiol-Fe Biphas (LO LOESTRIN FE) 1 MG-10 MCG / 10 MCG tablet Take 1 tablet by mouth daily. 84 tablet 0  . omeprazole (PRILOSEC) 20 MG capsule Take 20 mg by mouth daily.    Marland Kitchen PARoxetine (PAXIL) 10 MG tablet TAKE 1 TABLET BY MOUTH  EVERY DAY 90 tablet 4  . SOOLANTRA 1 % CREA 1(ONE) APPLICATION(S) TOPICAL EVERY DAY  5   No current facility-administered medications for this visit.    Review of Systems  Constitutional: Negative for chills, fatigue, fever and unexpected weight change.  HENT: Negative for trouble swallowing.  Eyes: Negative for loss of vision.  Respiratory: Negative for cough, shortness of breath and wheezing.  Cardiovascular: Negative for chest pain, leg swelling, palpitations and syncope.  GI: Negative for abdominal pain, blood in stool, diarrhea, nausea and vomiting.  GU: Negative for difficulty urinating, dysuria, frequency and hematuria.  Musculoskeletal: Negative for back pain, leg pain and joint pain.  Skin: Negative for rash.  Neurological: Negative for dizziness, headaches, light-headedness, numbness and seizures.  Psychiatric: Negative for behavioral problem, confusion, depressed mood and sleep disturbance.        Objective:  Objective   Vitals:   05/16/20 1555  BP: 114/70  Weight: 177 lb (80.3 kg)  Height: 5\' 3"  (1.6 m)   Body mass index is 31.35 kg/m.  Physical Exam Vitals and nursing note reviewed.  Constitutional:      Appearance: She is well-developed and well-nourished.  HENT:  Head: Normocephalic and atraumatic.  Eyes:     Extraocular Movements: EOM normal.     Pupils: Pupils are equal, round, and reactive to light.  Cardiovascular:     Rate and Rhythm: Normal rate and regular rhythm.  Pulmonary:     Effort: Pulmonary effort is normal. No respiratory distress.  Skin:    General: Skin is warm and dry.  Neurological:     Mental Status: She is alert and oriented to person, place, and time.  Psychiatric:        Mood and Affect: Mood and affect normal.        Behavior: Behavior normal.        Thought Content: Thought content normal.        Judgment: Judgment normal.     Assessment/Plan:     49 yo with vasomotor symptoms of menopause Discussed options to  continue Paxil vs HRT. Provided information on HRT. Patient desires to continue paxil at this time.  Elevated THS- Will follow up to check free T4 and free T3.   More than 20 minutes were spent face to face with the patient in the room, reviewing the medical record, labs and images, and coordinating care for the patient. The plan of management was discussed in detail and counseling was provided.    Adelene Idler MD [facility], [facility] 05/16/2020 5:07 PM

## 2020-05-16 NOTE — Patient Instructions (Addendum)
Hormone replacement therapy is for healthy, peri/postmenopausal women with moderate to severe vasomotor symptoms impacting sleep, quality of life, or ability to function, and who are within 10 years of menopause (or <47 years of age).  Exceptions include women with a history of breast cancer, coronary heart disease (CHD), a previous venous thromboembolic (VTE) event or stroke, active liver disease, or those at high risk for these complications.  Estimated VTE Incidence   54/100,000 per year in women in their 40s  62-122/100,000 per year in women in their 2s  300-400/100,000 per year in women aged 70-80 years.   700/100,000 per year in women in their 75s   Combination estrogen plus progestin hormone therapy (HT) or estrogen therapy (ET) for the management of menopausal symptoms and related disorders is associated with an increased risk of venous thromboembolism. Commonly, a relative increase in risk of twofold to fivefold is cited for HT users.   The use of estrogen alone has been associated with a 1.2-1.5-fold relative risk compared with that of nonusers  Standard recommendations for duration of use are three to five years. However, extended use is sometimes necessary for women with persistent, severe hot flashes.  We no longer use MHT for the prevention of chronic disease (osteoporosis, CHD, or dementia). However, there are some data to suggest that use of estrogen within the first 10 years after clinical menopause may reduce the risks of CHD and mortality.     Menopause and Hormone Replacement Therapy Menopause is a normal time of life when menstrual periods stop completely and the ovaries stop producing the female hormones estrogen and progesterone. This lack of hormones can affect your health and cause undesirable symptoms. Hormone replacement therapy (HRT) can relieve some of those symptoms. What is hormone replacement therapy? HRT is the use of artificial (synthetic) hormones to  replace hormones that your body has stopped producing because you have reached menopause. What are my options for HRT?  HRT may consist of the synthetic hormones estrogen and progestin, or it may consist of only estrogen (estrogen-only therapy). You and your health care provider will decide which form of HRT is best for you. If you choose to be on HRT and you have a uterus, estrogen and progestin are usually prescribed. Estrogen-only therapy is used for women who do not have a uterus. Possible options for taking HRT include:  Pills.  Patches.  Gels.  Sprays.  Vaginal cream.  Vaginal rings.  Vaginal inserts. The amount of hormone(s) that you take and how long you take the hormone(s) varies according to your health. It is important to:  Begin HRT with the lowest possible dosage.  Stop HRT as soon as your health care provider tells you to stop.  Work with your health care provider so that you feel informed and comfortable with your decisions. What are the benefits of HRT? HRT can reduce the frequency and severity of menopausal symptoms. Benefits of HRT vary according to the kind of symptoms that you have, how severe they are, and your overall health. HRT may help to improve the following symptoms of menopause:  Hot flashes and night sweats. These are sudden feelings of heat that spread over the face and body. The skin may turn red, like a blush. Night sweats are hot flashes that happen while you are sleeping or trying to sleep.  Bone loss (osteoporosis). The body loses calcium more quickly after menopause, causing the bones to become weaker. This can increase the risk for bone breaks (fractures).  Vaginal dryness. The lining of the vagina can become thin and dry, which can cause pain during sex or cause infection, burning, or itching.  Urinary tract infections.  Urinary incontinence. This is the inability to control when you pass urine.  Irritability.  Short-term memory  problems. What are the risks of HRT? Risks of HRT vary depending on your individual health and medical history. Risks of HRT also depend on whether you receive both estrogen and progestin or you receive estrogen only. HRT may increase the risk of:  Spotting. This is when a small amount of blood leaks from the vagina unexpectedly.  Endometrial cancer. This cancer is in the lining of the uterus (endometrium).  Breast cancer.  Increased density of breast tissue. This can make it harder to find breast cancer on a breast X-ray (mammogram).  Stroke.  Heart disease.  Blood clots.  Gallbladder disease.  Liver disease. Risks of HRT can increase if you have any of the following conditions:  Endometrial cancer.  Liver disease.  Heart disease.  Breast cancer.  History of blood clots.  History of stroke. Follow these instructions at home:  Take over-the-counter and prescription medicines only as told by your health care provider.  Get mammograms, pelvic exams, and medical checkups as often as told by your health care provider.  Have Pap tests done as often as told by your health care provider. A Pap test is sometimes called a Pap smear. It is a screening test that is used to check for signs of cancer of the cervix and vagina. A Pap test can also identify the presence of infection or precancerous changes. Pap tests may be done: ? Every 3 years, starting at age 69. ? Every 5 years, starting after age 26, in combination with testing for human papillomavirus (HPV). ? More often or less often depending on other medical conditions you have, your age, and other risk factors.  It is up to you to get the results of your Pap test. Ask your health care provider, or the department that is doing the test, when your results will be ready.  Keep all follow-up visits as told by your health care provider. This is important. Contact a health care provider if you have:  Pain or swelling in your  legs.  Shortness of breath.  Chest pain.  Lumps or changes in your breasts or armpits.  Slurred speech.  Pain, burning, or bleeding when you urinate.  Unusual vaginal bleeding.  Dizziness or headaches.  Weakness or numbness in any part of your arms or legs.  Pain in your abdomen. Summary  Menopause is a normal time of life when menstrual periods stop completely and the ovaries stop producing the female hormones estrogen and progesterone.  Hormone replacement therapy (HRT) can relieve some of the symptoms of menopause.  HRT can reduce the frequency and severity of menopausal symptoms.  Risks of HRT vary depending on your individual health and medical history. This information is not intended to replace advice given to you by your health care provider. Make sure you discuss any questions you have with your health care provider. Document Revised: 01/19/2018 Document Reviewed: 01/19/2018 Elsevier Patient Education  2020 [employer].

## 2020-06-05 ENCOUNTER — Other Ambulatory Visit: Payer: Self-pay

## 2020-06-05 ENCOUNTER — Encounter: Payer: Self-pay | Admitting: Gastroenterology

## 2020-06-07 ENCOUNTER — Encounter: Payer: Self-pay | Admitting: Anesthesiology

## 2020-06-11 ENCOUNTER — Encounter: Payer: Self-pay | Admitting: Emergency Medicine

## 2020-06-11 ENCOUNTER — Other Ambulatory Visit: Payer: Self-pay

## 2020-06-11 ENCOUNTER — Ambulatory Visit
Admission: EM | Admit: 2020-06-11 | Discharge: 2020-06-11 | Disposition: A | Discharge status: Home / Self Care | Payer: BC Managed Care – PPO | Attending: Family Medicine | Admitting: Family Medicine

## 2020-06-11 DIAGNOSIS — J069 Acute upper respiratory infection, unspecified: Secondary | ICD-10-CM

## 2020-06-11 DIAGNOSIS — U071 COVID-19: Secondary | ICD-10-CM | POA: Diagnosis not present

## 2020-06-11 LAB — SARS CORONAVIRUS 2 (TAT 6-24 HRS): SARS Coronavirus 2: POSITIVE — AB

## 2020-06-11 NOTE — ED Provider Notes (Signed)
[facility]    CSN: [id number] Arrival date & time: 06/11/20  4970      History   Chief Complaint Chief Complaint  Patient presents with  . Facial Pain  . Nasal Congestion    HPI Natasha Marshall is a 50 y.o. female.   HPI 50 year old female here for evaluation of facial pain and nasal congestion that started yesterday.  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  Patient has received her COVID-vaccine and her flu vaccine but she has not received her COVID booster.  Additionally, patient is complaining of ear pain, hoarseness, scratchy sore throat, and headache.  Patient denies fever, cough or shortness of breath, GI complaints, or body aches.    Past Medical History:  Diagnosis Date  . Anxiety   . Breast mass, left    benign  . Depression   . Optical Rosacea   . Orthodontics    permanent retainer - bottom, front  . Vertigo    1 episode summer 2021    There are no problems to display for this patient.   Past Surgical History:  Procedure Laterality Date  . TONSILLECTOMY  1992  . TUBAL LIGATION  2004  . TUBAL LIGATION      OB History    Gravida  2   Para  2   Term  2   Preterm      AB      Living  2     SAB      IAB      Ectopic      Multiple      Live Births  2            Home Medications    Prior to Admission medications   Medication Sig Start Date End Date Taking? Authorizing Provider  doxycycline (VIBRAMYCIN) 100 MG capsule Take 100 mg by mouth 2 (two) times daily. 01/16/20  Yes [provider]  ivermectin (STROMECTOL) 3 MG TABS tablet TK 7 TS PO AT ONCE AND REPEAT 1 WEEK LATER. WAIT 2 MONTHS TO REPEAT 11/24/17  Yes [provider]  Multiple Vitamins-Minerals (WOMENS ONE DAILY) TABS Take by mouth.   Yes [provider]  omeprazole (PRILOSEC) 20 MG capsule Take 20 mg by mouth daily.   Yes  [provider]  PARoxetine (PAXIL) 10 MG tablet TAKE 1 TABLET BY MOUTH EVERY DAY 05/07/20  Yes Schuman, Stefanie Libel, MD    Family History Family History  Problem Relation Age of Onset  . Basal cell carcinoma Maternal Grandmother 70       skin ca  . Colon cancer Maternal Grandmother 64  . COPD Maternal Grandmother   . Coronary artery disease Maternal Grandmother 54  . Stroke Maternal Grandmother   . Cancer Maternal Grandmother 49       uterine   . Breast cancer Neg Hx     Social History Social History   Tobacco Use  . Smoking status: Former Research scientist (life sciences)  . Smokeless tobacco: Never Used  . Tobacco comment: social as teenager for about 6 mos  Vaping Use  . Vaping Use: Never used  Substance Use Topics  . Alcohol use: Yes    Comment: social  . Drug use: No     Allergies   Patient has no known allergies.   Review of Systems Review of Systems  Constitutional: Negative for activity change, appetite change  and fever.  HENT: Positive for congestion, ear pain, sinus pressure, sinus pain and sore throat.   Respiratory: Negative for cough, shortness of breath and wheezing.   Gastrointestinal: Negative for diarrhea, nausea and vomiting.  Musculoskeletal: Negative for arthralgias and myalgias.  Neurological: Positive for headaches.  Hematological: Negative.   Psychiatric/Behavioral: Negative.      Physical Exam Triage Vital Signs ED Triage Vitals  Enc Vitals Group     BP 06/11/20 0927 106/78     Pulse Rate 06/11/20 0927 63     Resp 06/11/20 0927 18     Temp 06/11/20 0927 98.6 F (37 C)     Temp Source 06/11/20 0927 Oral     SpO2 06/11/20 0927 98 %     Weight 06/11/20 0922 175 lb (79.4 kg)     Height 06/11/20 0922 5' 3"  (1.6 m)     Head Circumference --      Peak Flow --      Pain Score 06/11/20 0922 8     Pain Loc --      Pain Edu? --      Excl. in [place]? --    No data found.  Updated Vital Signs BP 106/78 (BP Location: Right Arm)   Pulse 63   Temp 98.6  F (37 C) (Oral)   Resp 18   Ht 5' 3"  (1.6 m)   Wt 175 lb (79.4 kg)   LMP 05/21/2020 (Exact Date)   SpO2 98%   BMI 31.00 kg/m   Visual Acuity Right Eye Distance:   Left Eye Distance:   Bilateral Distance:    Right Eye Near:   Left Eye Near:    Bilateral Near:     Physical Exam Vitals and nursing note reviewed.  Constitutional:      General: She is not in acute distress.    Appearance: Normal appearance. She is normal weight. She is not toxic-appearing.  HENT:     Head: Normocephalic and atraumatic.     Right Ear: Tympanic membrane, ear canal and external ear normal.     Left Ear: Tympanic membrane, ear canal and external ear normal.     Nose: Congestion present. No rhinorrhea.     Comments: Nasal mucosa is edematous with mild erythema.  No nasal discharge appreciated on exam.    Mouth/Throat:     Mouth: Mucous membranes are moist.     Pharynx: Oropharynx is clear. Posterior oropharyngeal erythema present. No oropharyngeal exudate.     Comments: Mild posterior oropharyngeal erythema and clear postnasal drip. Cardiovascular:     Rate and Rhythm: Normal rate and regular rhythm.     Pulses: Normal pulses.     Heart sounds: Normal heart sounds. No murmur heard. No gallop.   Pulmonary:     Effort: Pulmonary effort is normal.     Breath sounds: Normal breath sounds. No wheezing, rhonchi or rales.  Musculoskeletal:     Cervical back: Normal range of motion and neck supple.  Lymphadenopathy:     Cervical: No cervical adenopathy.  Skin:    General: Skin is warm and dry.     Capillary Refill: Capillary refill takes less than 2 seconds.     Findings: No erythema or rash.  Neurological:     General: No focal deficit present.     Mental Status: She is alert and oriented to person, place, and time.  Psychiatric:        Mood and Affect: Mood normal.  Behavior: Behavior normal.        Thought Content: Thought content normal.        Judgment: Judgment normal.      UC  Treatments / Results  Labs (all labs ordered are listed, but only abnormal results are displayed) Labs Reviewed  SARS CORONAVIRUS 2 (TAT 6-24 HRS)    EKG   Radiology No results found.  Procedures Procedures (including critical care time)  Medications Ordered in UC Medications - No data to display  Initial Impression / Assessment and Plan / UC Course  I have reviewed the triage vital signs and the nursing notes.  Pertinent labs & imaging results that were available during my care of the patient were reviewed by me and considered in my medical decision making (see chart for details).   Patient is here for evaluation of facial pain and pressure and nasal congestion that started yesterday.  From [facility] where she was doing physical inventory and was exposed to [place] while she was out there.  Patient has been vaccinated but not boosted.  Patient has tenderness to percussion of her maxillary and frontal sinuses with edema and mild erythema of her nasal mucosa but no discharge.  Will swab for COVID and discharge patient home with ear precautions and supportive care.   Final Clinical Impressions(s) / UC Diagnoses   Final diagnoses:  Upper respiratory tract infection, unspecified type     Discharge Instructions     Isolate at home until the results of your COVID test are back.  If your test is positive then you will need to quarantine for 5 days from your symptoms started.  After the 5 days you can break quarantine if your symptoms have improved and you have not run a fever in 24 hours.  You will have to wear a mask around other folks for an additional 5 days.  Use over-the-counter Tylenol cold and sinus or Advil Cold and Sinus for nasal congestion and headache.  Perform sinus irrigation 2-3 times a day with a NeilMed sinus rinse kit and distilled water.  Do not use tap water.  If you develop shortness of breath, especially at rest, you are unable to speak in full sentences,  or you develop bluing around your lips you need to go to the ER for evaluation.    ED Prescriptions    None     PDMP not reviewed this encounter.   Margarette Canada, NP 06/11/20 [phone]

## 2020-06-11 NOTE — ED Triage Notes (Signed)
Patient c/o facial pain and pressure and nasal congestion that started yesterday. She states she was exposed to COVID.

## 2020-06-11 NOTE — Discharge Instructions (Addendum)
Isolate at home until the results of your COVID test are back.  If your test is positive then you will need to quarantine for 5 days from your symptoms started.  After the 5 days you can break quarantine if your symptoms have improved and you have not run a fever in 24 hours.  You will have to wear a mask around other folks for an additional 5 days.  Use over-the-counter Tylenol cold and sinus or Advil Cold and Sinus for nasal congestion and headache.  Perform sinus irrigation 2-3 times a day with a NeilMed sinus rinse kit and distilled water.  Do not use tap water.  If you develop shortness of breath, especially at rest, you are unable to speak in full sentences, or you develop bluing around your lips you need to go to the ER for evaluation.

## 2020-07-18 ENCOUNTER — Telehealth: Payer: Self-pay | Admitting: Gastroenterology

## 2020-07-18 NOTE — Telephone Encounter (Signed)
Patient called Natasha Marshall that she needs to reschedule her Colonoscopy on 07/27/20

## 2020-07-19 NOTE — Telephone Encounter (Signed)
Returned pt's call and rescheduled colonoscopy to 01/11/21.

## 2020-09-22 NOTE — Progress Notes (Signed)
Gynecology Annual Exam  PCP: Patient, No Pcp Per  Chief Complaint:     Chief Complaint  Patient presents with  . Gynecologic Exam   History of Present Illness: Patient is a 50 y.o. [id number] presents for annual exam. The patient has no complaints today.  LMP: Patient's last menstrual period was 09/14/2017 (exact date).  Average Interval: irregular, 50-75 days  Duration of flow: 5 days  Heavy Menses: no  Clots: no  Intermenstrual Bleeding: no  Postcoital Bleeding: no  Dysmenorrhea: no  The patient is sexually active. She currently uses tubal ligation for contraception. She denies dyspareunia. The patient does perform self breast exams. There is no notable family history of breast or ovarian cancer in her family.  The patient wears seatbelts: yes. The patient has regular exercise: yes.  The patient denies current symptoms of depression.  Review of Systems: ROS  Past Medical History:      Past Medical History:  Diagnosis Date  . Anxiety   . Breast mass, left    benign  . Depression   . Optical Rosacea    Past Surgical History:       Past Surgical History:  Procedure Laterality Date  . TONSILLECTOMY  1992  . TUBAL LIGATION  2004  . TUBAL LIGATION     Gynecologic History:  Patient's last menstrual period was 09/14/2017 (exact date).  Contraception: tubal ligation  Last Pap: Results were: 2017 NIL  Last mammogram: 2018 Results were: BI-RAD I  Obstetric History: [id number]  Family History:       Family History  Problem Relation Age of Onset  . Basal cell carcinoma Maternal Grandmother 70   skin ca  . Colon cancer Maternal Grandmother 30  . COPD Maternal Grandmother   . Coronary artery disease Maternal Grandmother 54  . Stroke Maternal Grandmother   . Cancer Maternal Grandmother 49   uterine   . Breast cancer Neg Hx    Social History:  Social History        Socioeconomic History  . Marital status: Divorced    Spouse name: Not on file  . Number of children: 2  .  Years of education: 67  . Highest education level: Not on file  Occupational History  . Not on file  Social Needs  . Financial resource strain: Not on file  . Food insecurity:    Worry: Not on file    Inability: Not on file  . Transportation needs:    Medical: Not on file    Non-medical: Not on file  Tobacco Use  . Smoking status: Former Games developer  . Smokeless tobacco: Never Used  Substance and Sexual Activity  . Alcohol use: Yes    Comment: social  . Drug use: No  . Sexual activity: Yes    Birth control/protection: Surgical    Comment: Tubal ligation   Lifestyle  . Physical activity:    Days per week: Not on file    Minutes per session: Not on file  . Stress: Not on file  Relationships  . Social connections:    Talks on phone: Not on file    Gets together: Not on file    Attends religious service: Not on file    Active member of club or organization: Not on file    Attends meetings of clubs or organizations: Not on file    Relationship status: Not on file  . Intimate partner violence:    Fear of current or ex partner: Not on file  Emotionally abused: Not on file    Physically abused: Not on file    Forced sexual activity: Not on file  Other Topics Concern  . Not on file  Social History Narrative  . Not on file   Allergies:  No Known Allergies  Medications:         Prior to Admission medications   Medication Sig Start Date End Date Taking? Authorizing Provider  Doxycycline Hyclate 200 MG TBEC Take by mouth.   Yes [provider]  ivermectin (STROMECTOL) 3 MG TABS tablet TK 7 TS PO AT ONCE AND REPEAT 1 WEEK LATER. WAIT 2 MONTHS TO REPEAT 11/24/17  Yes [provider]  SOOLANTRA 1 % CREA 1(ONE) APPLICATION(S) TOPICAL EVERY DAY 11/24/17  Yes [provider]  Physical Exam  Vitals: Blood pressure 98/62, pulse 65, height 5\' 2"  (1.575 m), weight 161 lb (73 kg), last menstrual period 09/14/2017.  General: NAD  HEENT: normocephalic, anicteric   Thyroid: no enlargement, no palpable nodules  Pulmonary: No increased work of breathing, CTAB  Cardiovascular: RRR, distal pulses 2+  Breast: Breast symmetrical, no tenderness, no skin or nipple retraction present, no nipple discharge. No axillary or supraclavicular lymphadenopathy.  Abdomen: NABS, soft, non-tender, non-distended. Umbilicus without lesions. No hepatomegaly, splenomegaly or masses palpable. No evidence of hernia  Genitourinary:  External: Normal external female genitalia. Normal urethral meatus, normal Bartholin's and Skene's glands.  Vagina: Normal vaginal mucosa, no evidence of prolapse.  Cervix: Grossly normal in appearance, no bleeding  Uterus: Non-enlarged, mobile, normal contour. No CMT  Adnexa: ovaries non-enlarged, no adnexal masses  Rectal: deferred  Lymphatic: no evidence of inguinal lymphadenopathy  Extremities: no edema, erythema, or tenderness  Neurologic: Grossly intact  Psychiatric: mood appropriate, affect full  Female chaperone present for pelvic and breast portions of the physical exam  Assessment: 50 y.o. G2P2002 routine annual exam  Plan:     Problem List Items Addressed This Visit     None            Visit Diagnoses     Health care maintenance - Primary   Screening for breast cancer    Encounter for annual routine gynecological examination    Visit for pelvic exam       1) Mammogram - recommend yearly screening mammogram. Mammogram Was ordered today  2) STI screening was offered and accepted.  3) ASCCP guidelines and rational discussed. Patient opts for yearly screening interval  4) Contraception - the patient is currently using tubal ligation. She is happy with her current form of contraception and plans to continue  5) Colonoscopy -- Screening recommended starting at age 40 for average risk individuals, age 53 for individuals deemed at increased risk (including African Americans) and recommended to continue until age 41. For patient  age 60-85 individualized approach is recommended. Gold standard screening is via colonoscopy, Cologuard screening is an acceptable alternative for patient unwilling or unable to undergo colonoscopy. "Colorectal cancer screening for average?risk adults: 2018 guideline update from the American Cancer Society"CA: A Cancer Journal for Clinicians: Oct 29, 2016  6) Routine healthcare maintenance including cholesterol, diabetes screening discussed Ordered today. She will follow up for her lipid panel.  7) No follow-ups on file.  October 31, 2016 MD  [facility], [facility]  11/27/17  2:50 PM

## 2021-01-04 ENCOUNTER — Telehealth: Payer: Self-pay | Admitting: Gastroenterology

## 2021-01-04 NOTE — Telephone Encounter (Signed)
Per patients request, procedure has been cancelled due to personal reasons. Pt will contact office when ready to r/s. [facility] has been notified of this change. Natasha Marshall)

## 2021-01-04 NOTE — Telephone Encounter (Signed)
Patient lvm that she has to cx her procedure on 01/11/21 due to new job and waiting for insurance to become effective. Will r/s at a later date.

## 2021-01-11 ENCOUNTER — Ambulatory Visit
Admission: RE | Admit: 2021-01-11 | Payer: No Typology Code available for payment source | Source: Home / Self Care | Admitting: Gastroenterology

## 2021-01-11 HISTORY — DX: Dizziness and giddiness: R42

## 2021-01-11 HISTORY — DX: Encounter for fitting and adjustment of orthodontic device: Z46.4

## 2021-01-11 HISTORY — DX: Reserved for inherently not codable concepts without codable children: IMO0001

## 2021-01-11 SURGERY — COLONOSCOPY WITH PROPOFOL
Anesthesia: General

## 2021-06-13 ENCOUNTER — Other Ambulatory Visit: Payer: Self-pay | Admitting: Obstetrics and Gynecology

## 2021-06-13 DIAGNOSIS — R232 Flushing: Secondary | ICD-10-CM

## 2021-08-26 ENCOUNTER — Other Ambulatory Visit: Payer: Self-pay

## 2021-08-26 ENCOUNTER — Ambulatory Visit: Payer: No Typology Code available for payment source | Admitting: Obstetrics and Gynecology

## 2021-08-26 ENCOUNTER — Encounter: Payer: Self-pay | Admitting: Obstetrics and Gynecology

## 2021-08-26 ENCOUNTER — Other Ambulatory Visit (HOSPITAL_COMMUNITY)
Admission: RE | Admit: 2021-08-26 | Discharge: 2021-08-26 | Disposition: A | Discharge status: Home / Self Care | Payer: No Typology Code available for payment source | Source: Ambulatory Visit | Attending: Obstetrics and Gynecology | Admitting: Obstetrics and Gynecology

## 2021-08-26 VITALS — BP 120/80 | Ht 62.0 in | Wt 198.0 lb

## 2021-08-26 DIAGNOSIS — Z124 Encounter for screening for malignant neoplasm of cervix: Secondary | ICD-10-CM

## 2021-08-26 DIAGNOSIS — N950 Postmenopausal bleeding: Secondary | ICD-10-CM

## 2021-08-26 NOTE — Patient Instructions (Signed)
Postmenopausal Bleeding Postmenopausal bleeding is any bleeding that a woman has after she has entered menopause. Menopause is the end of a woman's fertile years. After menopause, a woman no longer ovulates and does not have menstrual periods. Therefore, she should no longer have bleeding from her vagina. Postmenopausal bleeding may have various causes, including: Menopausal hormone therapy (MHT). Endometrial atrophy. After menopause, low estrogen hormone levels cause the membrane that lines the uterus (endometrium) to become thin. You may have bleeding as the endometrium thins. Endometrial hyperplasia. This condition is caused by excess estrogen hormones and low levels of progesterone hormones. The excess estrogen causes the endometrium to thicken, which can lead to bleeding. In some cases, this can lead to cancer of the uterus. Endometrial cancer. Noncancerous growths (polyps) on the endometrium, the lining of the uterus, or the cervix. Uterine fibroids. These are noncancerous growths in or around the uterus muscle tissue that can cause heavy bleeding. Any type of postmenopausal bleeding, even if it appears to be a typical menstrual period, should be checked by your health care provider. Treatment will depend on the cause of the bleeding. Follow these instructions at home:  Pay attention to any changes in your symptoms. Let your health care provider know about them. Avoid using tampons and douches as told by your health care provider. Change your pads regularly. Get regular pelvic exams, including Pap tests, as told by your health care provider. Take iron supplements as told by your health care provider. Take over-the-counter and prescription medicines only as told by your health care provider. Keep all follow-up visits. This is important. Contact a health care provider if: You have new bleeding from the vagina after menopause. You have pain in your abdomen. Get help right away if: You have  a fever or chills. You have severe pain with bleeding. You are passing blood clots. You have heavy bleeding, need more than 1 pad an hour, and have never experienced this before. You have headaches or feel faint or dizzy. Summary Postmenopausal bleeding is any bleeding that a woman has after she has entered into menopause. Postmenopausal bleeding may have various causes. Treatment will depend on the cause of the bleeding. Any type of postmenopausal bleeding, even if it appears to be a typical menstrual period, should be checked by your health care provider. Be sure to pay attention to any changes in your symptoms and keep all follow-up visits. This information is not intended to replace advice given to you by your health care provider. Make sure you discuss any questions you have with your health care provider. Document Revised: 11/03/2019 Document Reviewed: 11/03/2019 Elsevier Patient Education  2022 [employer]  

## 2021-08-26 NOTE — Progress Notes (Signed)
? ?Patient ID: Natasha Marshall, female   DOB: 12/31/1970, 51 y.o.   MRN: [id number] ? ?Reason for Consult: Menstrual Problem (No period for 405 days then had one start last 08/24/21) ?  ?Referred by No ref. provider found ? ?Subjective:  ?   ?HPI: ? ?Natasha Marshall is a 51 y.o. female.  She presents today for concerns regarding abnormal uterine bleeding.  She reports that she went more than 400 days without having a menstrual cycle.  Then suddenly last night she began having heavy menstrual bleeding.  She called the office and was scheduled for a visit today.  She has been taking Paxil for hot flashes which she reports is significantly improved her hot flashes.   ? ?Gynecological History ? ?Patient's last menstrual period was 08/24/2021. ? ?Past Medical History:  ?Diagnosis Date  ? Anxiety   ? Breast mass, left   ? benign  ? Depression   ? Optical Rosacea   ? Orthodontics   ? permanent retainer - bottom, front  ? Vertigo   ? 1 episode summer 2021  ? ?Family History  ?Problem Relation Age of Onset  ? Basal cell carcinoma Maternal Grandmother 70  ?     skin ca  ? Colon cancer Maternal Grandmother 52  ? COPD Maternal Grandmother   ? Coronary artery disease Maternal Grandmother 70  ? Stroke Maternal Grandmother   ? Cancer Maternal Grandmother 25  ?     uterine   ? Breast cancer Neg Hx   ? ?Past Surgical History:  ?Procedure Laterality Date  ? TONSILLECTOMY  1992  ? TUBAL LIGATION  2004  ? TUBAL LIGATION    ? ? ?Short Social History:  ?Social History  ? ?Tobacco Use  ? Smoking status: Former  ? Smokeless tobacco: Never  ? Tobacco comments:  ?  social as teenager for about 6 mos  ?Substance Use Topics  ? Alcohol use: Yes  ?  Comment: social  ? ? ?No Known Allergies ? ?Current Outpatient Medications  ?Medication Sig Dispense Refill  ? doxycycline (VIBRAMYCIN) 100 MG capsule Take 100 mg by mouth 2 (two) times daily.    ? PARoxetine (PAXIL) 10 MG tablet TAKE 1 TABLET BY MOUTH EVERY DAY 90 tablet 4  ? ivermectin (STROMECTOL) 3 MG  TABS tablet TK 7 TS PO AT ONCE AND REPEAT 1 WEEK LATER. WAIT 2 MONTHS TO REPEAT (Patient not taking: Reported on 08/26/2021)  5  ? Multiple Vitamins-Minerals (WOMENS ONE DAILY) TABS Take by mouth. (Patient not taking: Reported on 08/26/2021)    ? omeprazole (PRILOSEC) 20 MG capsule Take 20 mg by mouth daily. (Patient not taking: Reported on 08/26/2021)    ? ?No current facility-administered medications for this visit.  ? ? ?Review of Systems  ?Constitutional: Negative for chills, fatigue, fever and unexpected weight change.  ?HENT: Negative for trouble swallowing.  ?Eyes: Negative for loss of vision.  ?Respiratory: Negative for cough, shortness of breath and wheezing.  ?Cardiovascular: Negative for chest pain, leg swelling, palpitations and syncope.  ?GI: Negative for abdominal pain, blood in stool, diarrhea, nausea and vomiting.  ?GU: Negative for difficulty urinating, dysuria, frequency and hematuria.  ?Musculoskeletal: Negative for back pain, leg pain and joint pain.  ?Skin: Negative for rash.  ?Neurological: Negative for dizziness, headaches, light-headedness, numbness and seizures.  ?Psychiatric: Negative for behavioral problem, confusion, depressed mood and sleep disturbance.   ? ?   ?Objective:  ?Objective  ? ?Vitals:  ? 08/26/21 1014  ?BP: 120/80  ?Weight:  198 lb (89.8 kg)  ?Height: 5\' 2"  (1.575 m)  ? ?Body mass index is 36.21 kg/m?. ? ?Physical Exam ?Vitals and nursing note reviewed. Exam conducted with a chaperone present.  ?Constitutional:   ?   Appearance: Normal appearance. She is well-developed.  ?HENT:  ?   Head: Normocephalic and atraumatic.  ?Eyes:  ?   Extraocular Movements: Extraocular movements intact.  ?   Pupils: Pupils are equal, round, and reactive to light.  ?Cardiovascular:  ?   Rate and Rhythm: Normal rate and regular rhythm.  ?Pulmonary:  ?   Effort: Pulmonary effort is normal. No respiratory distress.  ?   Breath sounds: Normal breath sounds.  ?Abdominal:  ?   General: Abdomen is flat.  ?    Palpations: Abdomen is soft.  ?Genitourinary: ?   Comments: External: Normal appearing vulva. No lesions noted.  ?Speculum examination: Normal appearing cervix. No blood in the vaginal vault. No discharge.   ?Bimanual examination: Uterus midline, non-tender, normal in size, shape and contour.  No CMT. No adnexal masses. No adnexal tenderness. Pelvis not fixed. ? ?Breast exam: exam not performed ?Musculoskeletal:     ?   General: No signs of injury.  ?Skin: ?   General: Skin is warm and dry.  ?Neurological:  ?   Mental Status: She is alert and oriented to person, place, and time.  ?Psychiatric:     ?   Behavior: Behavior normal.     ?   Thought Content: Thought content normal.     ?   Judgment: Judgment normal.  ? ? ?Assessment/Plan:  ?  ? ?51 year old with postmenopausal bleeding.  ?Vaginal exam today did not show any heavy active bleeding. ?Will obtain pelvic ultrasound and have the patient follow-up after the pelvic ultrasound.  Discussed that I would recommend endometrial sampling if her lining is greater than 4 mm. ?Discussed that endometrial sampling can be formed performed by endometrial biopsy in office or hysteroscopy D&C. ?Pap smear was collected today. ? ?We will plan close follow-up after her pelvic ultrasound.  Since I am transitioning jobs she may have to follow up with a new gynecologist in the office. She is aware. ? ?More than 20 minutes were spent face to face with the patient in the room, reviewing the medical record, labs and images, and coordinating care for the patient. The plan of management was discussed in detail and counseling was provided.  ? ?  ? ?52 MD ?[facility], [facility] ?08/26/2021 ?10:19 AM ? ? ?

## 2021-08-29 LAB — CYTOLOGY - PAP
Comment: NEGATIVE
Diagnosis: NEGATIVE
Diagnosis: REACTIVE
High risk HPV: NEGATIVE

## 2021-09-12 ENCOUNTER — Ambulatory Visit (INDEPENDENT_AMBULATORY_CARE_PROVIDER_SITE_OTHER): Payer: No Typology Code available for payment source

## 2021-09-12 DIAGNOSIS — N950 Postmenopausal bleeding: Secondary | ICD-10-CM | POA: Diagnosis not present

## 2021-09-23 ENCOUNTER — Encounter: Payer: Self-pay | Admitting: Obstetrics and Gynecology

## 2021-09-23 ENCOUNTER — Ambulatory Visit: Payer: No Typology Code available for payment source | Admitting: Obstetrics and Gynecology

## 2021-09-23 ENCOUNTER — Other Ambulatory Visit (HOSPITAL_COMMUNITY)
Admission: RE | Admit: 2021-09-23 | Discharge: 2021-09-23 | Disposition: A | Discharge status: Home / Self Care | Payer: No Typology Code available for payment source | Source: Ambulatory Visit | Attending: Obstetrics and Gynecology | Admitting: Obstetrics and Gynecology

## 2021-09-23 VITALS — BP 120/70 | Ht 62.0 in | Wt 198.0 lb

## 2021-09-23 DIAGNOSIS — N950 Postmenopausal bleeding: Secondary | ICD-10-CM | POA: Insufficient documentation

## 2021-09-23 DIAGNOSIS — Z712 Person consulting for explanation of examination or test findings: Secondary | ICD-10-CM | POA: Diagnosis not present

## 2021-09-23 NOTE — Progress Notes (Signed)
51 yo P2 returning to discuss results of her pelvic ultrasound. Patient reports vaginal bleeding has stopped and is now reduced to vaginal spotting noted when she wipes. She denies any pelvic pain. She is not sexually active. She is without any new complaints ? ?Past Medical History:  ?Diagnosis Date  ? Anxiety   ? Breast mass, left   ? benign  ? Depression   ? Optical Rosacea   ? Orthodontics   ? permanent retainer - bottom, front  ? Vertigo   ? 1 episode summer 2021  ? ?Past Surgical History:  ?Procedure Laterality Date  ? TONSILLECTOMY  1992  ? TUBAL LIGATION  2004  ? TUBAL LIGATION    ? ?Family History  ?Problem Relation Age of Onset  ? Basal cell carcinoma Maternal Grandmother 70  ?     skin ca  ? Colon cancer Maternal Grandmother 76  ? COPD Maternal Grandmother   ? Coronary artery disease Maternal Grandmother 101  ? Stroke Maternal Grandmother   ? Cancer Maternal Grandmother 82  ?     uterine   ? Breast cancer Neg Hx   ? ?Social History  ? ?Tobacco Use  ? Smoking status: Former  ? Smokeless tobacco: Never  ? Tobacco comments:  ?  social as teenager for about 6 mos  ?Vaping Use  ? Vaping Use: Never used  ?Substance Use Topics  ? Alcohol use: Yes  ?  Comment: social  ? Drug use: No  ? ?ROS  ?See pertinent in HPI. All other systems reviewed and non contributory ?Blood pressure 120/70, height 5\' 2"  (1.575 m), weight 198 lb (89.8 kg), last menstrual period 08/24/2021. ?GENERAL: Well-developed, well-nourished female in no acute distress.  ?ABDOMEN: Soft, nontender, nondistended. No organomegaly. ?PELVIC: Normal external female genitalia. Vagina is pink and rugated.  Normal discharge. Normal appearing cervix. Uterus is normal in size. No adnexal mass or tenderness. Chaperone present during the pelvic exam ?EXTREMITIES: No cyanosis, clubbing, or edema, 2+ distal pulses. ? ?08/2021 ?ULTRASOUND REPORT ?  ?Location: [facility]  ?Date of Service: 09/12/2021  ?  ?Indications:Abnormal Uterine Bleeding  ?   ?Findings:  ?The uterus is retroverted and measures 9.7x 6.5 x 6.6 cm. ?Echo texture is heterogenous without evidence of focal masses. ?  ?The Endometrium measures 20.4 mm, with active bleeding noted. ?  ?Right Ovary measures 1.0 x 1.8 x 1.8 cm. It is normal in appearance. ?Left Ovary measures 3.1 x 2.5 x 2.2 cm. It is normal in appearance; 2.1 cm cyst/dominant follicle noted. ?Survey of the adnexa demonstrates no adnexal masses. ?There is no free fluid in the cul de sac. ?  ?Impression: ?1. 20.4 mm endometrium with active bleeding seen within it.  ?2. 2.1 cm Cyst/Dominant follicle Left ovary. ?  ?Recommendations: ?1.Clinical correlation with the patient's History and Physical Exam. ?  ?Vivien Rota  Henderson-Gainey ? ? ?A/P  51 yo with postmenopausal vaginal bleeding ?- Reviewed ultrasound report with the patient ?- Discussed benefits of endometrial biopsy which patient agreed to ?ENDOMETRIAL BIOPSY     ?The indications for endometrial biopsy were reviewed.   Risks of the biopsy including cramping, bleeding, infection, uterine perforation, inadequate specimen and need for additional procedures  were discussed. The patient states she understands and agrees to undergo procedure today. Consent was signed. Time out was performed. Urine HCG was negative. ?A sterile speculum was placed in the patient's vagina and the cervix was prepped with Betadine. A single-toothed tenaculum was placed on the anterior lip  of the cervix to stabilize it. The uterine cavity was sounded to a depth of 9 cm using the uterine sound. The 3 mm pipelle was introduced into the endometrial cavity without difficulty, 2 passes were made.  A  moderate amount of tissue was  sent to pathology. The instruments were removed from the patient's vagina. Minimal bleeding from the cervix was noted. The patient tolerated the procedure well.  Routine post-procedure instructions were given to the patient. The patient will follow up in two weeks to review the results  and for further management.  ? ? ?

## 2021-09-25 LAB — SURGICAL PATHOLOGY

## 2022-01-02 ENCOUNTER — Encounter: Payer: Self-pay | Admitting: Obstetrics and Gynecology

## 2022-01-02 DIAGNOSIS — N950 Postmenopausal bleeding: Secondary | ICD-10-CM

## 2022-01-03 MED ORDER — MEDROXYPROGESTERONE ACETATE 10 MG PO TABS: 10.0000 mg | ORAL_TABLET | Freq: Every day | ORAL | 0 refills | Status: DC

## 2022-03-26 IMAGING — MG DIGITAL DIAGNOSTIC BILAT W/ TOMO W/ CAD
6 of 12 series · 6 of 36 positions shown · non-contrast
Comparison: Previous exam(s).

CLINICAL DATA: 49-year-old female with palpable lump in the UPPER
RIGHT breast discovered on clinical examination. Also for annual
bilateral mammogram.

EXAM:
DIGITAL DIAGNOSTIC BILATERAL MAMMOGRAM WITH CAD AND TOMO
ULTRASOUND RIGHT BREAST

[R MLO synth-2D (1 of 2)]
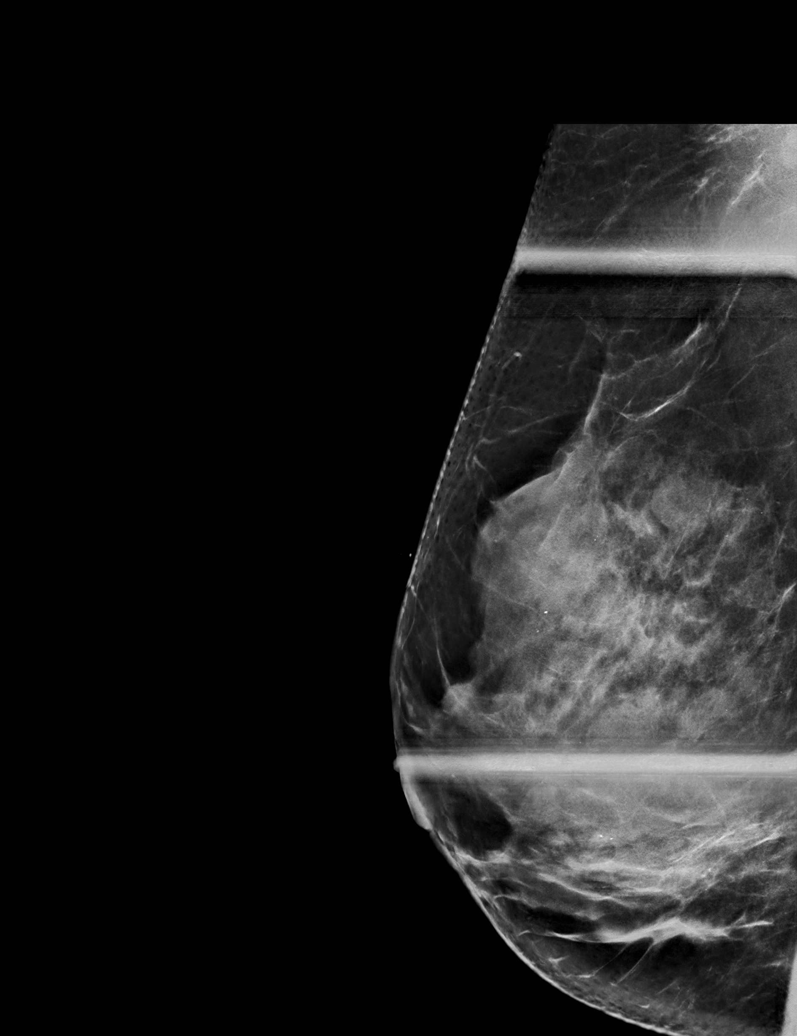

[L CC synth-2D]
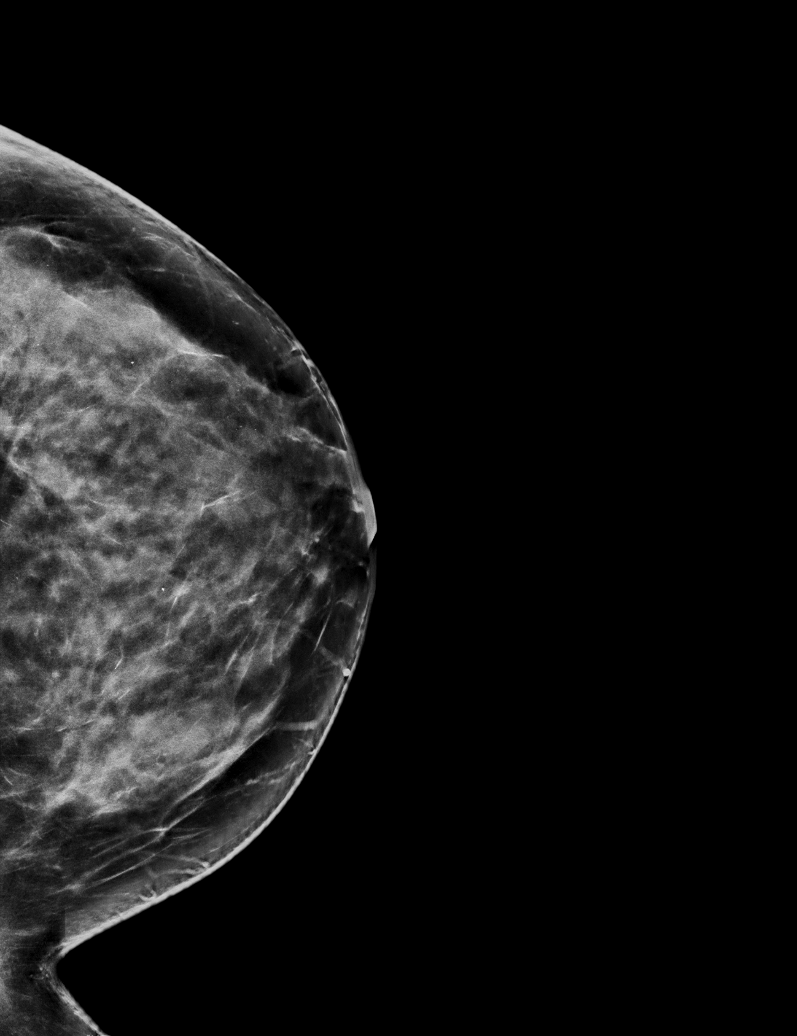

[L MLO synth-2D (1 of 2)]
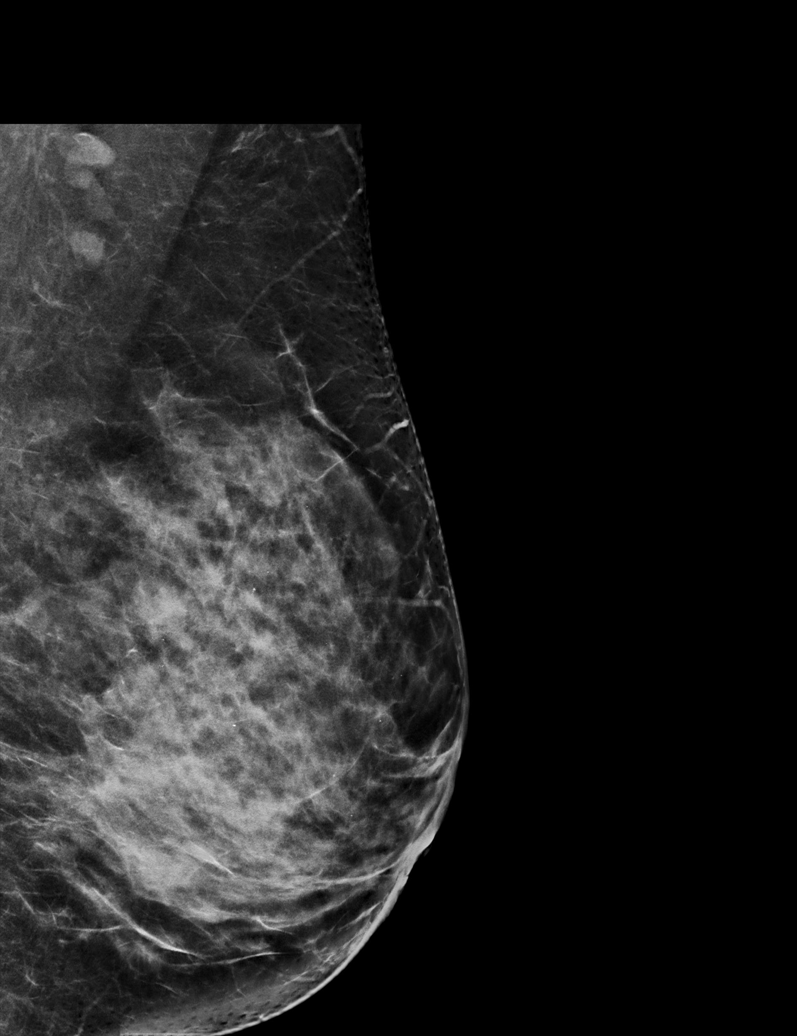

[R CC synth-2D]
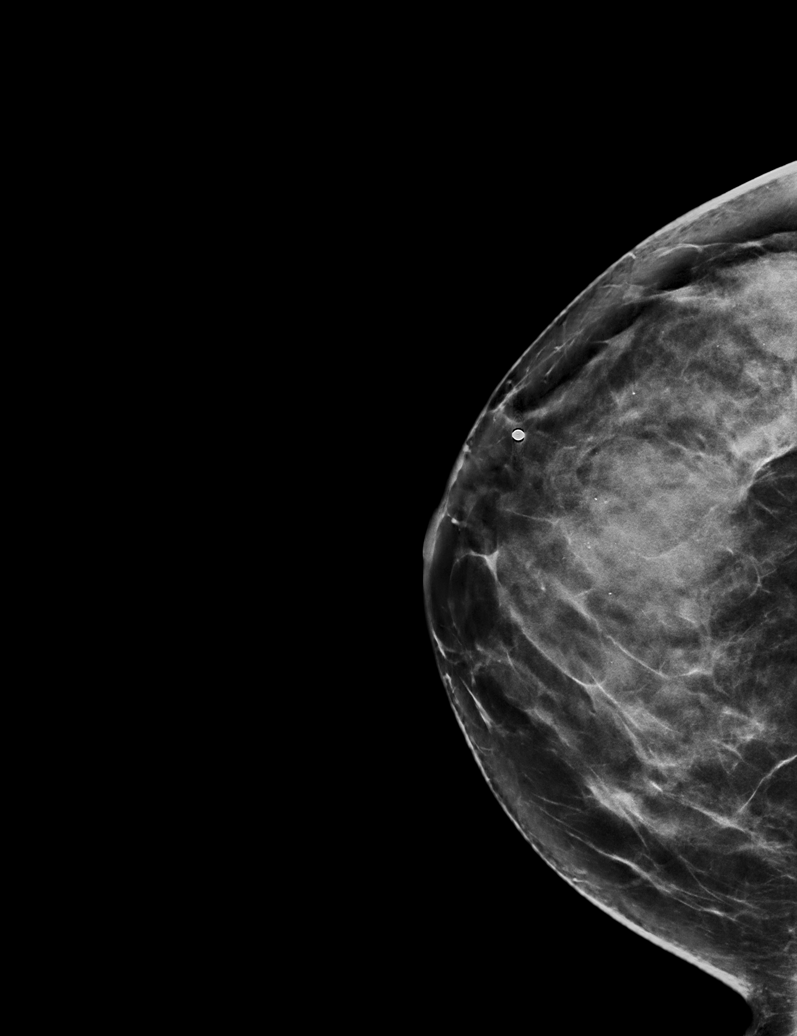

[R MLO synth-2D (2 of 2)]
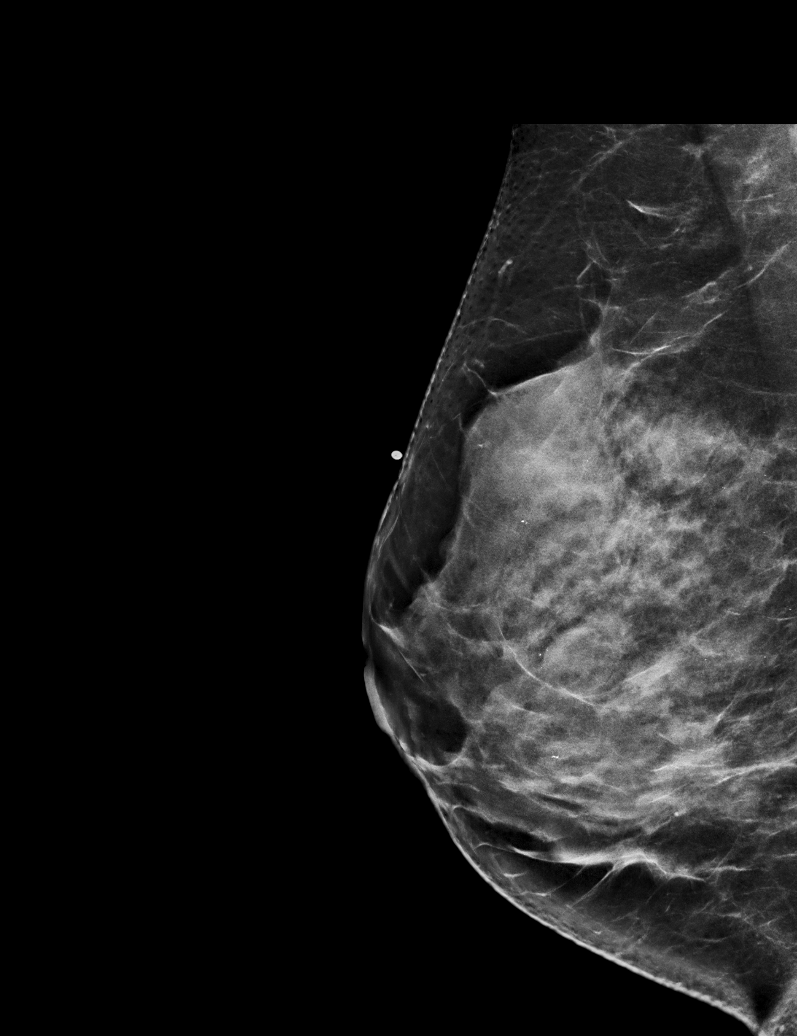

[L MLO synth-2D (2 of 2)]
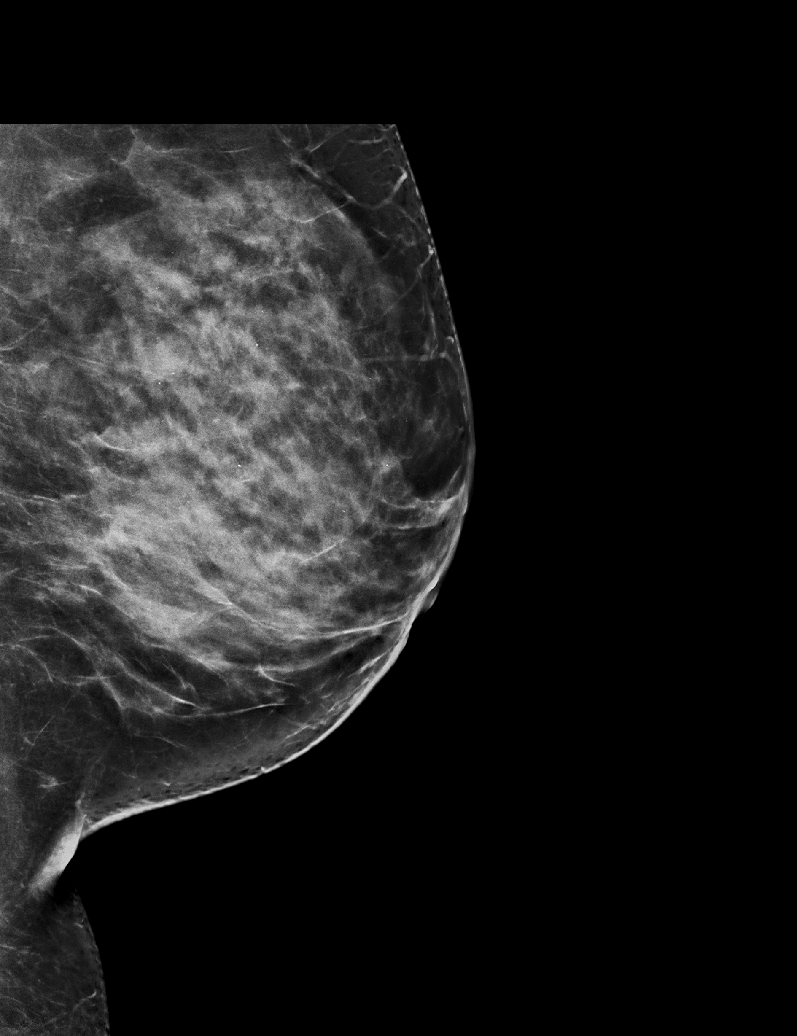

[6 of 36 positions shown; findings below may reference images not displayed]

ACR Breast Density Category d: The breast tissue is extremely dense,
which lowers the sensitivity of mammography.
FINDINGS: 2D/3D full field views of both breasts and spot compression view of
the RIGHT breast demonstrate a circumscribed oval mass within the
UPPER RIGHT breast.

Multiple circumscribed oval masses within both breasts are again
noted.

No suspicious calcifications or definite distortion identified.

Mammographic images were processed with CAD.

On physical exam, a palpable mobile mass is identified at the 12
o'clock position of the RIGHT breast 4 cm from the nipple.

Targeted ultrasound is performed, showing a 2.6 x 1.9 x 2.4 cm
benign complicated cyst without internal vascular flow at the 12
o'clock position of the RIGHT breast 4 cm from the nipple,
corresponding to the patient's palpable abnormality.
IMPRESSION: 1. Benign cyst within the UPPER RIGHT breast corresponding to the
patient's palpable abnormality.
2. No evidence of breast malignancy.

RECOMMENDATION:
Bilateral screening mammogram in 1 year.

I have discussed the findings and recommendations with the patient.
If applicable, a reminder letter will be sent to the patient
regarding the next appointment.

BI-RADS CATEGORY  2: Benign.

## 2022-12-02 ENCOUNTER — Ambulatory Visit: Payer: No Typology Code available for payment source | Admitting: Licensed Practical Nurse

## 2022-12-02 VITALS — BP 110/71 | HR 70 | Ht 62.0 in | Wt 204.1 lb

## 2022-12-02 DIAGNOSIS — Z01419 Encounter for gynecological examination (general) (routine) without abnormal findings: Secondary | ICD-10-CM | POA: Diagnosis not present

## 2022-12-02 DIAGNOSIS — Z131 Encounter for screening for diabetes mellitus: Secondary | ICD-10-CM

## 2022-12-02 DIAGNOSIS — Z13220 Encounter for screening for lipoid disorders: Secondary | ICD-10-CM

## 2022-12-02 NOTE — Progress Notes (Unsigned)
Gynecology Annual Exam  PCP: Patient, No Pcp Per  Chief Complaint:  Chief Complaint  Patient presents with   Gynecologic Exam    History of Present Illness: Patient is a 52 y.o. G2P2002 presents for annual exam. The patient would like order for a mammogram and colonoscopy   LMP: Patient's last menstrual period was 01/01/2022 (exact date). Gets hot flashes, but they are not severe  Postcoital Bleeding: no    The patient is not sexually active. She currently uses post menopausal status for contraception. She denies dyspareunia.  The patient does perform self breast exams.  There is notable family history of breast or ovarian cancer in her family.  The patient wears seatbelts: yes.   The patient has regular exercise: yes.  Uses the rower per day then walks for another , currently trying to lose weight with a healthy diet and exercise   The patient denies current symptoms of depression.  Reports stress is alittel high d/t work , they are in a busy season Works as an Airline pilot  Lives with her 6 year old son  PCP" no longer has, was told she was "too healthy" so they dismissed her, she only went for annual exams as she did not need anything else.  Dental up to date Wears glasses eye exam up to date     Review of Systems: ROS see HPI   Past Medical History:  There are no problems to display for this patient.   Past Surgical History:  Past Surgical History:  Procedure Laterality Date   TONSILLECTOMY  1992   TUBAL LIGATION  2004   TUBAL LIGATION      Gynecologic History:  Patient's last menstrual period was 01/01/2022 (exact date). Contraception: post menopausal status Last Pap: Results were: 2023 NIL and HR HPV negative  Last mammogram: 2021 Results were: BI-RAD I, benign cyst in upper right breast,  Obstetric History: [id number]  Family History:  Family History  Problem Relation Age of Onset   Basal cell carcinoma Maternal Grandmother 70       skin ca    Colon cancer Maternal Grandmother 64   COPD Maternal Grandmother    Coronary artery disease Maternal Grandmother 32   Stroke Maternal Grandmother    Cancer Maternal Grandmother 79       uterine    Breast cancer Neg Hx     Social History:  Social History   Socioeconomic History   Marital status: Divorced    Spouse name: Not on file   Number of children: 2   Years of education: 16   Highest education level: Not on file  Occupational History   Not on file  Tobacco Use   Smoking status: Former   Smokeless tobacco: Never   Tobacco comments:    social as teenager for about 6 mos  Vaping Use   Vaping Use: Never used  Substance and Sexual Activity   Alcohol use: Yes    Comment: social   Drug use: No   Sexual activity: Yes    Birth control/protection: Surgical    Comment: Tubal ligation   Other Topics Concern   Not on file  Social History Narrative   Not on file   Social Determinants of Health   Financial Resource Strain: Not on file  Food Insecurity: Not on file  Transportation Needs: Not on file  Physical Activity: Not on file  Stress: Not on file  Social Connections: Not on file  Intimate Partner Violence:  Not on file    Allergies:  No Known Allergies  Medications: Prior to Admission medications   Medication Sig Start Date End Date Taking? Authorizing Provider  doxycycline (VIBRAMYCIN) 100 MG capsule Take 100 mg by mouth 2 (two) times daily. 01/16/20  Yes [provider]  Multiple Vitamins-Minerals (WOMENS ONE DAILY) TABS Take by mouth.   Yes [provider]  omeprazole (PRILOSEC) 20 MG capsule Take 20 mg by mouth daily.   Yes [provider]  ivermectin (STROMECTOL) 3 MG TABS tablet TK 7 TS PO AT ONCE AND REPEAT 1 WEEK LATER. WAIT 2 MONTHS TO REPEAT Patient not taking: Reported on 08/26/2021 11/24/17   [provider]  medroxyPROGESTERone (PROVERA) 10 MG tablet Take 1 tablet (10 mg total) by mouth daily. Use for ten days  01/03/22   Hildred Laser, MD  PARoxetine (PAXIL) 10 MG tablet TAKE 1 TABLET BY MOUTH EVERY DAY 06/17/21   Natale Milch, MD   Pt stopped Paxil   Physical Exam Vitals: Blood pressure 110/71, pulse 70, height 5\' 2"  (1.575 m), weight 204 lb 1.6 oz (92.6 kg), last menstrual period 01/01/2022.  General: NAD HEENT: normocephalic, anicteric Thyroid: no enlargement, no palpable nodules Pulmonary: No increased work of breathing, CTAB Cardiovascular: RRR, distal pulses 2+ Breast: Breast symmetrical, no tenderness, no skin or nipple retraction present, no nipple discharge.  No axillary or supraclavicular lymphadenopathy. Left breast 2-3cm fluctuant mobile mass palpated, pt states that has been there and has not changed in size.  Abdomen: NABS, soft, non-tender, non-distended.  Umbilicus without lesions.  No hepatomegaly, splenomegaly or masses palpable. No evidence of hernia  Genitourinary:  External: Normal external female genitalia.  Normal urethral meatus, normal Bartholin's and Skene's glands.    Vagina: Normal vaginal mucosa, no evidence of prolapse.  Some tone   Cervix: Grossly normal in appearance, no bleeding  Uterus: Non-enlarged, mobile, normal contour.  No CMT  Adnexa: ovaries non-enlarged, no adnexal masses  Rectal: deferred  Lymphatic: no evidence of inguinal lymphadenopathy Extremities: no edema, erythema, or tenderness Neurologic: Grossly intact Psychiatric: mood appropriate, affect full   Assessment: 52 y.o. G2P2002 routine annual exam  Plan: Problem List Items Addressed This Visit   None Visit Diagnoses     Well woman exam    -  Primary   Relevant Orders   Hemoglobin A1c   Lipid panel   MM DIGITAL SCREENING BILATERAL   Ambulatory referral to Gastroenterology   Screening for diabetes mellitus       Relevant Orders   Hemoglobin A1c   Screening cholesterol level       Relevant Orders   Lipid panel       1) Mammogram - recommend yearly screening mammogram.   Mammogram Was ordered today   2) STI screening  wasoffered and declined  3) ASCCP guidelines and rational discussed.  Patient opts for every 3 years screening interval  4) Contraception - the patient is currently using  post menopausal status.  She is happy with her current form of contraception and plans to continue  5) Colonoscopy -- Screening recommended starting at age 90 for average risk individuals, age 43 for individuals deemed at increased risk (including African Americans) and recommended to continue until age 84.  For patient age 26-85 individualized approach is recommended.  Gold standard screening is via colonoscopy, Cologuard screening is an acceptable alternative for patient unwilling or unable to undergo colonoscopy.  "Colorectal cancer screening for average?risk adults: 2018 guideline update from the American Cancer  Society"CA: A Cancer Journal for Clinicians: Oct 29, 2016   6) Routine healthcare maintenance including cholesterol, diabetes screening discussed To return fasting at a later date  7) No follow-ups on file.  Carie Caddy, CNM  [facility] 12/03/2022, 5:14 PM

## 2022-12-03 ENCOUNTER — Encounter: Payer: Self-pay | Admitting: Licensed Practical Nurse

## 2022-12-08 ENCOUNTER — Telehealth: Payer: Self-pay

## 2022-12-08 ENCOUNTER — Other Ambulatory Visit: Payer: Self-pay

## 2022-12-08 ENCOUNTER — Other Ambulatory Visit: Payer: Self-pay | Admitting: Licensed Practical Nurse

## 2022-12-08 DIAGNOSIS — Z1231 Encounter for screening mammogram for malignant neoplasm of breast: Secondary | ICD-10-CM

## 2022-12-08 DIAGNOSIS — Z1211 Encounter for screening for malignant neoplasm of colon: Secondary | ICD-10-CM

## 2022-12-08 MED ORDER — NA SULFATE-K SULFATE-MG SULF 17.5-3.13-1.6 GM/177ML PO SOLN: 1.0000 | Freq: Once | ORAL | 0 refills | Status: AC

## 2022-12-08 NOTE — Telephone Encounter (Signed)
pt left message to schedule colonoscopy please return call

## 2022-12-08 NOTE — Telephone Encounter (Signed)
Gastroenterology Pre-Procedure Review  Request Date: 01/23/23 Requesting Physician: Dr. Servando Snare  PATIENT REVIEW QUESTIONS: The patient responded to the following health history questions as indicated:    1. Are you having any GI issues? no 2. Do you have a personal history of Polyps? no 3. Do you have a family history of Colon Cancer or Polyps? yes (maternal 1st cousin colon cancer) 4. Diabetes Mellitus? no 5. Joint replacements in the past 12 months?no 6. Major health problems in the past 3 months?no 7. Any artificial heart valves, MVP, or defibrillator?no    MEDICATIONS & ALLERGIES:    Patient reports the following regarding taking any anticoagulation/antiplatelet therapy:   Plavix, Coumadin, Eliquis, Xarelto, Lovenox, Pradaxa, Brilinta, or Effient? no Aspirin? no  Patient confirms/reports the following medications:  Current Outpatient Medications  Medication Sig Dispense Refill   doxycycline (VIBRAMYCIN) 100 MG capsule Take 100 mg by mouth 2 (two) times daily.     Multiple Vitamins-Minerals (WOMENS ONE DAILY) TABS Take by mouth.     omeprazole (PRILOSEC) 20 MG capsule Take 20 mg by mouth daily.     No current facility-administered medications for this visit.    Patient confirms/reports the following allergies:  No Known Allergies  No orders of the defined types were placed in this encounter.   AUTHORIZATION INFORMATION Primary Insurance: 1D#: Group #:  Secondary Insurance: 1D#: Group #:  SCHEDULE INFORMATION: Date: 01/23/23 Time: Location: [facility]

## 2022-12-11 ENCOUNTER — Other Ambulatory Visit: Payer: No Typology Code available for payment source

## 2022-12-25 ENCOUNTER — Other Ambulatory Visit: Payer: No Typology Code available for payment source

## 2023-01-14 ENCOUNTER — Ambulatory Visit
Admission: RE | Admit: 2023-01-14 | Discharge: 2023-01-14 | Disposition: A | Discharge status: Home / Self Care | Payer: No Typology Code available for payment source | Source: Ambulatory Visit | Attending: Licensed Practical Nurse | Admitting: Licensed Practical Nurse

## 2023-01-14 DIAGNOSIS — Z1231 Encounter for screening mammogram for malignant neoplasm of breast: Secondary | ICD-10-CM | POA: Insufficient documentation

## 2023-01-19 ENCOUNTER — Encounter: Payer: Self-pay | Admitting: Gastroenterology

## 2023-01-19 NOTE — Anesthesia Preprocedure Evaluation (Addendum)
Anesthesia Evaluation  Patient identified by MRN, date of birth, ID band Patient awake    Reviewed: Allergy & Precautions, H&P , NPO status , Patient's Chart, lab work & pertinent test results  Airway Mallampati: II  TM Distance: <3 FB Neck ROM: Full    Dental no notable dental hx.    Pulmonary former smoker   Pulmonary exam normal breath sounds clear to auscultation       Cardiovascular negative cardio ROS Normal cardiovascular exam Rhythm:Regular Rate:Normal     Neuro/Psych  PSYCHIATRIC DISORDERS Anxiety Depression    negative neurological ROS     GI/Hepatic negative GI ROS, Neg liver ROS,,,  Endo/Other  negative endocrine ROS    Renal/GU negative Renal ROS  negative genitourinary   Musculoskeletal negative musculoskeletal ROS (+)    Abdominal   Peds negative pediatric ROS (+)  Hematology negative hematology ROS (+)   Anesthesia Other Findings  Anxiety  Depression Breast mass, left  Optical Rosacea Vertigo Orthodontics    Reproductive/Obstetrics negative OB ROS                             Anesthesia Physical Anesthesia Plan  ASA: 2  Anesthesia Plan: General   Post-op Pain Management:    Induction: Intravenous  PONV Risk Score and Plan:   Airway Management Planned: Natural Airway and Nasal Cannula  Additional Equipment:   Intra-op Plan:   Post-operative Plan:   Informed Consent: I have reviewed the patients History and Physical, chart, labs and discussed the procedure including the risks, benefits and alternatives for the proposed anesthesia with the patient or authorized representative who has indicated his/her understanding and acceptance.     Dental Advisory Given  Plan Discussed with: Anesthesiologist, CRNA and Surgeon  Anesthesia Plan Comments: (Patient consented for risks of anesthesia including but not limited to:  - adverse reactions to medications -  risk of airway placement if required - damage to eyes, teeth, lips or other oral mucosa - nerve damage due to positioning  - sore throat or hoarseness - Damage to heart, brain, nerves, lungs, other parts of body or loss of life  Patient voiced understanding.)       Anesthesia Quick Evaluation

## 2023-01-23 ENCOUNTER — Encounter: Payer: Self-pay | Admitting: Gastroenterology

## 2023-01-23 ENCOUNTER — Ambulatory Visit: Payer: No Typology Code available for payment source | Admitting: Anesthesiology

## 2023-01-23 ENCOUNTER — Other Ambulatory Visit: Payer: Self-pay

## 2023-01-23 ENCOUNTER — Ambulatory Visit
Admission: RE | Admit: 2023-01-23 | Discharge: 2023-01-23 | Disposition: A | Discharge status: Home / Self Care | Payer: No Typology Code available for payment source | Attending: Gastroenterology | Admitting: Gastroenterology

## 2023-01-23 ENCOUNTER — Encounter
Admission: RE | Disposition: A | Discharge status: Home / Self Care | Payer: Self-pay | Source: Home / Self Care | Attending: Gastroenterology

## 2023-01-23 DIAGNOSIS — K640 First degree hemorrhoids: Secondary | ICD-10-CM | POA: Diagnosis not present

## 2023-01-23 DIAGNOSIS — Z87891 Personal history of nicotine dependence: Secondary | ICD-10-CM | POA: Diagnosis not present

## 2023-01-23 DIAGNOSIS — K648 Other hemorrhoids: Secondary | ICD-10-CM

## 2023-01-23 DIAGNOSIS — K635 Polyp of colon: Secondary | ICD-10-CM

## 2023-01-23 DIAGNOSIS — Z1211 Encounter for screening for malignant neoplasm of colon: Secondary | ICD-10-CM | POA: Insufficient documentation

## 2023-01-23 HISTORY — PX: POLYPECTOMY: SHX5525

## 2023-01-23 HISTORY — PX: COLONOSCOPY WITH PROPOFOL: SHX5780

## 2023-01-23 SURGERY — COLONOSCOPY WITH PROPOFOL
Anesthesia: General

## 2023-01-23 MED ORDER — STERILE WATER FOR IRRIGATION IR SOLN
Status: DC | PRN
  Administered 2023-01-23: 1

## 2023-01-23 MED ORDER — LIDOCAINE HCL (CARDIAC) PF 100 MG/5ML IV SOSY
PREFILLED_SYRINGE | INTRAVENOUS | Status: DC | PRN
  Administered 2023-01-23: 40 mg via INTRATRACHEAL

## 2023-01-23 MED ORDER — LACTATED RINGERS IV SOLN: INTRAVENOUS | Status: DC | PRN

## 2023-01-23 MED ORDER — PROPOFOL 10 MG/ML IV BOLUS
INTRAVENOUS | Status: DC | PRN
  Administered 2023-01-23: 20 mg via INTRAVENOUS
  Administered 2023-01-23: 40 mg via INTRAVENOUS
  Administered 2023-01-23 (×2): 20 mg via INTRAVENOUS
  Administered 2023-01-23: 40 mg via INTRAVENOUS
  Administered 2023-01-23 (×4): 20 mg via INTRAVENOUS

## 2023-01-23 SURGICAL SUPPLY — 21 items

## 2023-01-23 NOTE — H&P (Signed)
Midge Minium, MD [facility] [address], [address] [place], [place] [address] Phone: [phone] Fax : [phone]  Primary Care Physician:  Patient, No Pcp Per Primary Gastroenterologist:  Dr. Servando Snare  Pre-Procedure History & Physical: HPI:  Natasha Marshall is a 52 y.o. female is here for a screening colonoscopy.   Past Medical History:  Diagnosis Date   Anxiety    Breast mass, left    benign   Depression    Optical Rosacea    Orthodontics    permanent retainer - bottom, front   Vertigo    1 episode summer 2021    Past Surgical History:  Procedure Laterality Date   TONSILLECTOMY  1992   TUBAL LIGATION  2004   TUBAL LIGATION      Prior to Admission medications   Medication Sig Start Date End Date Taking? Authorizing Provider  doxycycline (VIBRAMYCIN) 100 MG capsule Take 100 mg by mouth 2 (two) times daily. 01/16/20  Yes [provider]  IVERMECTIN PO Take by mouth as needed.   Yes [provider]  Multiple Vitamins-Minerals (WOMENS ONE DAILY) TABS Take by mouth.   Yes [provider]  omeprazole (PRILOSEC) 20 MG capsule Take 20 mg by mouth daily.   Yes [provider]    Allergies as of 12/08/2022   (No Known Allergies)    Family History  Problem Relation Age of Onset   Basal cell carcinoma Maternal Grandmother 70       skin ca   Colon cancer Maternal Grandmother 64   COPD Maternal Grandmother    Coronary artery disease Maternal Grandmother 15   Stroke Maternal Grandmother    Cancer Maternal Grandmother 34       uterine    Breast cancer Neg Hx     Social History   Socioeconomic History   Marital status: Divorced    Spouse name: Not on file   Number of children: 2   Years of education: 16   Highest education level: Not on file  Occupational History   Not on file  Tobacco Use   Smoking status: Former   Smokeless tobacco: Never   Tobacco comments:    social as teenager for about 6 mos  Vaping Use   Vaping status: Never  Used  Substance and Sexual Activity   Alcohol use: Yes    Comment: social   Drug use: No   Sexual activity: Yes    Birth control/protection: Surgical    Comment: Tubal ligation   Other Topics Concern   Not on file  Social History Narrative   Not on file   Social Determinants of Health   Financial Resource Strain: Not on file  Food Insecurity: Not on file  Transportation Needs: Not on file  Physical Activity: Not on file  Stress: Not on file  Social Connections: Not on file  Intimate Partner Violence: Not on file    Review of Systems: See HPI, otherwise negative ROS  Physical Exam: BP 131/84   Pulse 69   Resp 18   Ht 5\' 2"  (1.575 m)   Wt 89 kg   LMP 12/30/2021 (Exact Date)   SpO2 95%   BMI 35.87 kg/m  General:   Alert,  pleasant and cooperative in NAD Head:  Normocephalic and atraumatic. Neck:  Supple; no masses or thyromegaly. Lungs:  Clear throughout to auscultation.    Heart:  Regular rate and rhythm. Abdomen:  Soft, nontender and nondistended. Normal bowel sounds, without guarding, and without rebound.  Neurologic:  Alert and  oriented x4;  grossly normal neurologically.  Impression/Plan: Natasha Marshall is now here to undergo a screening colonoscopy.  Risks, benefits, and alternatives regarding colonoscopy have been reviewed with the patient.  Questions have been answered.  All parties agreeable.

## 2023-01-23 NOTE — Op Note (Signed)
[facility] Patient Name: Natasha Marshall Procedure Date: 01/23/2023 9:56 AM MRN: [id number] Account #: [id number] Date of Birth: [birth year] Admit Type: Outpatient Age: 52 Room: [facility] OR ROOM 01 Gender: Female Note Status: Finalized Instrument Name: [id number] Procedure:             Colonoscopy Indications:           Screening for colorectal malignant neoplasm Providers:             Midge Minium MD, MD Medicines:             Propofol per Anesthesia Complications:         No immediate complications. Procedure:             Pre-Anesthesia Assessment:                        - Prior to the procedure, a History and Physical was                         performed, and patient medications and allergies were                         reviewed. The patient's tolerance of previous                         anesthesia was also reviewed. The risks and benefits                         of the procedure and the sedation options and risks                         were discussed with the patient. All questions were                         answered, and informed consent was obtained. Prior                         Anticoagulants: The patient has taken no anticoagulant                         or antiplatelet agents. ASA Grade Assessment: II - A                         patient with mild systemic disease. After reviewing                         the risks and benefits, the patient was deemed in                         satisfactory condition to undergo the procedure.                        After obtaining informed consent, the colonoscope was                         passed under direct vision. Throughout the procedure,                         the patient's blood pressure,  pulse, and oxygen                         saturations were monitored continuously. The                         Colonoscope was introduced through the anus and                         advanced to the the cecum,  identified by appendiceal                         orifice and ileocecal valve. The colonoscopy was                         performed without difficulty. The patient tolerated                         the procedure well. The quality of the bowel                         preparation was excellent. Findings:      The perianal and digital rectal examinations were normal.      A 5 mm polyp was found in the sigmoid colon. The polyp was sessile. The       polyp was removed with a cold snare. Resection and retrieval were       complete.      A 3 mm polyp was found in the cecum. The polyp was sessile. The polyp       was removed with a cold biopsy forceps. Resection and retrieval were       complete.      Non-bleeding internal hemorrhoids were found during retroflexion. The       hemorrhoids were Grade I (internal hemorrhoids that do not prolapse). Impression:            - One 5 mm polyp in the sigmoid colon, removed with a                         cold snare. Resected and retrieved.                        - One 3 mm polyp in the cecum, removed with a cold                         biopsy forceps. Resected and retrieved.                        - Non-bleeding internal hemorrhoids. Recommendation:        - Discharge patient to home.                        - Resume previous diet.                        - Continue present medications.                        - Await pathology results.                        -  If the pathology report reveals adenomatous tissue,                         then repeat the colonoscopy for surveillance in 7                         years. Procedure Code(s):     --- Professional ---                        [phone], Colonoscopy, flexible; with removal of                         tumor(s), polyp(s), or other lesion(s) by snare                         technique                        45380, 59, Colonoscopy, flexible; with biopsy, single                         or multiple Diagnosis  Code(s):     --- Professional ---                        Z12.11, Encounter for screening for malignant neoplasm                         of colon                        D12.5, Benign neoplasm of sigmoid colon CPT copyright 2022 American Medical Association. All rights reserved. The codes documented in this report are preliminary and upon coder review may  be revised to meet current compliance requirements. Midge Minium MD, MD 01/23/2023 10:28:59 AM This report has been signed electronically. Number of Addenda: 0 Note Initiated On: 01/23/2023 9:56 AM Scope Withdrawal Time: 0 hours 10 minutes 13 seconds  Total Procedure Duration: 0 hours 13 minutes 39 seconds  Estimated Blood Loss:  Estimated blood loss: none. Estimated blood loss: none.      [facility]

## 2023-01-23 NOTE — Transfer of Care (Signed)
Immediate Anesthesia Transfer of Care Note  Patient: Natasha Marshall  Procedure(s) Performed: COLONOSCOPY WITH PROPOFOL POLYPECTOMY  Patient Location: PACU  Anesthesia Type: General  Level of Consciousness: awake, alert  and patient cooperative  Airway and Oxygen Therapy: Patient Spontanous Breathing and Patient connected to supplemental oxygen  Post-op Assessment: Post-op Vital signs reviewed, Patient's Cardiovascular Status Stable, Respiratory Function Stable, Patent Airway and No signs of Nausea or vomiting  Post-op Vital Signs: Reviewed and stable  Complications: No notable events documented.

## 2023-01-23 NOTE — Anesthesia Postprocedure Evaluation (Signed)
Anesthesia Post Note  Patient: Natasha Marshall  Procedure(s) Performed: COLONOSCOPY WITH PROPOFOL POLYPECTOMY  Patient location during evaluation: PACU Anesthesia Type: General Level of consciousness: awake and alert Pain management: pain level controlled Vital Signs Assessment: post-procedure vital signs reviewed and stable Respiratory status: spontaneous breathing, nonlabored ventilation, respiratory function stable and patient connected to nasal cannula oxygen Cardiovascular status: blood pressure returned to baseline and stable Postop Assessment: no apparent nausea or vomiting Anesthetic complications: no   No notable events documented.   Last Vitals:  Vitals:   01/23/23 1031 01/23/23 1041  BP: (!) 86/55 105/66  Pulse: 83   Resp:    Temp: 36.6 C (!) 36.4 C  SpO2: 97%     Last Pain:  Vitals:   01/23/23 1041  PainSc: 0-No pain                 Kerri Asche C Kairi Tufo

## 2023-01-28 ENCOUNTER — Encounter: Payer: Self-pay | Admitting: Gastroenterology

## 2023-06-04 ENCOUNTER — Ambulatory Visit
Admission: RE | Admit: 2023-06-04 | Discharge: 2023-06-04 | Disposition: A | Discharge status: Home / Self Care | Payer: No Typology Code available for payment source | Source: Ambulatory Visit | Attending: Family Medicine | Admitting: Family Medicine

## 2023-06-04 ENCOUNTER — Ambulatory Visit (INDEPENDENT_AMBULATORY_CARE_PROVIDER_SITE_OTHER): Payer: No Typology Code available for payment source

## 2023-06-04 VITALS — BP 128/79 | HR 72 | Temp 98.7°F | Ht 62.0 in | Wt 200.0 lb

## 2023-06-04 DIAGNOSIS — R0602 Shortness of breath: Secondary | ICD-10-CM

## 2023-06-04 DIAGNOSIS — J40 Bronchitis, not specified as acute or chronic: Secondary | ICD-10-CM

## 2023-06-04 DIAGNOSIS — J329 Chronic sinusitis, unspecified: Secondary | ICD-10-CM | POA: Diagnosis not present

## 2023-06-04 DIAGNOSIS — Z87891 Personal history of nicotine dependence: Secondary | ICD-10-CM

## 2023-06-04 MED ORDER — AMOXICILLIN-POT CLAVULANATE 875-125 MG PO TABS: 1.0000 | ORAL_TABLET | Freq: Two times a day (BID) | ORAL | 0 refills | Status: DC

## 2023-06-04 MED ORDER — ALBUTEROL SULFATE HFA 108 (90 BASE) MCG/ACT IN AERS: 2.0000 | INHALATION_SPRAY | RESPIRATORY_TRACT | 0 refills | Status: DC | PRN

## 2023-06-04 NOTE — ED Triage Notes (Signed)
 Pt c/o facial pain, sinus congestion, SOB, pain in midsection while breathing x5days  Pt was exposed to pneumonia on 05/27/23

## 2023-06-04 NOTE — Discharge Instructions (Signed)
 Stop by the pharmacy to pick up your prescriptions.  Follow up with your primary care provider as needed.

## 2023-06-04 NOTE — ED Provider Notes (Addendum)
 [facility]    CSN: [id number] Arrival date & time: 06/04/23  0848      History   Chief Complaint Chief Complaint  Patient presents with   Nasal Congestion    HPI Natasha Marshall is a 53 y.o. female.   HPI  History obtained from the patient. Natasha Marshall presents for nasal congestion, facial pain, cough, SOB that started on Sunday. Cough gets worse at night. Tried ibuprofen , NyQuil, Dayquil and Tylenol sinus with little relief. No fever, vomiting, diarrhea. Her sister was diagnosed with pneumonia and she was exposed to her on Christmas day.   No personal history of asthma. Denies smoking and vaping.      Past Medical History:  Diagnosis Date   Anxiety    Breast mass, left    benign   Depression    Optical Rosacea    Orthodontics    permanent retainer - bottom, front   Vertigo    1 episode summer 2021    Patient Active Problem List   Diagnosis Date Noted   Special screening for malignant neoplasms, colon 01/23/2023   Polyp of sigmoid colon 01/23/2023    Past Surgical History:  Procedure Laterality Date   COLONOSCOPY WITH PROPOFOL  N/A 01/23/2023   Procedure: COLONOSCOPY WITH PROPOFOL ;  Surgeon: Jinny Carmine, MD;  Location: [facility];  Service: Endoscopy;  Laterality: N/A;   POLYPECTOMY N/A 01/23/2023   Procedure: POLYPECTOMY;  Surgeon: Jinny Carmine, MD;  Location: [facility];  Service: Endoscopy;  Laterality: N/A;   TONSILLECTOMY  1992   TUBAL LIGATION  2004   TUBAL LIGATION      OB History     Gravida  2   Para  2   Term  2   Preterm      AB      Living  2      SAB      IAB      Ectopic      Multiple      Live Births  2            Home Medications    Prior to Admission medications   Medication Sig Start Date End Date Taking? Authorizing Provider  albuterol  (VENTOLIN  HFA) 108 (90 Base) MCG/ACT inhaler Inhale 2 puffs into the lungs every 4 (four) hours as needed. 06/04/23  Yes Lynx Goodrich, DO   amoxicillin -clavulanate (AUGMENTIN ) 875-125 MG tablet Take 1 tablet by mouth every 12 (twelve) hours. 06/04/23  Yes Audie Wieser, DO  IVERMECTIN PO Take by mouth as needed.   Yes [provider]  Multiple Vitamins-Minerals (WOMENS ONE DAILY) TABS Take by mouth.   Yes [provider]  omeprazole (PRILOSEC) 20 MG capsule Take 20 mg by mouth daily.   Yes [provider]    Family History Family History  Problem Relation Age of Onset   Basal cell carcinoma Maternal Grandmother 70       skin ca   Colon cancer Maternal Grandmother 64   COPD Maternal Grandmother    Coronary artery disease Maternal Grandmother 37   Stroke Maternal Grandmother    Cancer Maternal Grandmother 54       uterine    Breast cancer Neg Hx     Social History Social History   Tobacco Use   Smoking status: Former   Smokeless tobacco: Never   Tobacco comments:    social as teenager for about 6 mos  Vaping Use   Vaping status: Never Used  Substance Use  Topics   Alcohol use: Yes    Comment: social   Drug use: No     Allergies   Patient has no known allergies.   Review of Systems Review of Systems: negative unless otherwise stated in HPI.      Physical Exam Triage Vital Signs ED Triage Vitals  Encounter Vitals Group     BP      Systolic BP Percentile      Diastolic BP Percentile      Pulse      Resp      Temp      Temp src      SpO2      Weight      Height      Head Circumference      Peak Flow      Pain Score      Pain Loc      Pain Education      Exclude from Growth Chart    No data found.  Updated Vital Signs BP 128/79 (BP Location: Left Arm)   Pulse 72   Temp 98.7 F (37.1 C) (Oral)   Ht 5' 2 (1.575 m)   Wt 90.7 kg   LMP 12/30/2021 (Exact Date)   SpO2 96%   BMI 36.58 kg/m   Visual Acuity Right Eye Distance:   Left Eye Distance:   Bilateral Distance:    Right Eye Near:   Left Eye Near:    Bilateral Near:     Physical Exam GEN:      alert, ill but non-toxic appearing female in no distress    HENT:  mucus membranes moist, oropharyngeal without lesions or erythema, no tonsillar hypertrophy or exudates,  moderate erythematous edematous turbinates, thick  nasal discharge, mild frontal sinus tenderness, significant maxillary sinus tenderness EYES:   pupils equal and reactive, no scleral injection or discharge NECK:  normal ROM, no meningismus   RESP:  no increased work of breathing, clear to auscultation bilaterally CVS:   regular rate and rhythm Skin:   warm and dry, no rash on visible skin    UC Treatments / Results  Labs (all labs ordered are listed, but only abnormal results are displayed) Labs Reviewed - No data to display  EKG   Radiology DG Chest 2 View Result Date: 06/04/2023 CLINICAL DATA:  Cough.  Pneumonia EXAM: CHEST - 2 VIEW COMPARISON:  None Available. FINDINGS: The heart size and mediastinal contours are within normal limits. No consolidation, pneumothorax or effusion. No edema. Curvature of the spine. The visualized skeletal structures are unremarkable. IMPRESSION: No acute cardiopulmonary disease. Electronically Signed   By: Ranell Bring M.D.   On: 06/04/2023 10:27    Procedures Procedures (including critical care time)  Medications Ordered in UC Medications - No data to display  Initial Impression / Assessment and Plan / UC Course  I have reviewed the triage vital signs and the nursing notes.  Pertinent labs & imaging results that were available during my care of the patient were reviewed by me and considered in my medical decision making (see chart for details).       Pt is a 53 y.o. female who presents for 5 days of respiratory symptoms. Tirzah is afebrile here without recent antipyretics. Satting well on room air. Overall pt is non-toxic appearing, well hydrated, without respiratory distress. Pulmonary exam is unremarkable.  COVID and influenza testing deferred due to duration of symptoms.  On  exam, she has significant maxillary sinus tenderness with  edematous and erythematous turbinates with thick nasal discharge.  I suspect that she could have an element of sinusitis.    She is concerned that she has pneumonia as she has shortness of breath and was exposed to pneumonia on Christmas Day by her sister.  Chest x-ray obtained and personally reviewed by me showing no focal pneumonia, pleural effusion, rib fractures or cardiomegaly. Patient aware the radiologist has not read her xray and is comfortable with the preliminary read by me. Will review radiologist read when available and call patient if a change in plan is warranted.  Pt agreeable to this plan prior to discharge.   Treat presumed bacterial sinobronchitis with antibiotics as below.  Albuterol  inhaler for  shortness of breath..  Typical duration of symptoms discussed.   Return and ED precautions given and voiced understanding. Discussed MDM, treatment plan and plan for follow-up with patient who agrees with plan.   Radiologist impression reviewed.    Final Clinical Impressions(s) / UC Diagnoses   Final diagnoses:  SOB (shortness of breath)  Sinobronchitis  Former smoker     Discharge Instructions      Stop by the pharmacy to pick up your prescriptions.  Follow up with your primary care provider as needed.      ED Prescriptions     Medication Sig Dispense Auth. Provider   amoxicillin -clavulanate (AUGMENTIN ) 875-125 MG tablet Take 1 tablet by mouth every 12 (twelve) hours. 14 tablet Eilyn Polack, DO   albuterol  (VENTOLIN  HFA) 108 (90 Base) MCG/ACT inhaler Inhale 2 puffs into the lungs every 4 (four) hours as needed. 6.7 g Caryn Gienger, DO      PDMP not reviewed this encounter.       Trevante Tennell, DO 06/04/23 1100

## 2023-12-10 ENCOUNTER — Other Ambulatory Visit: Payer: Self-pay

## 2023-12-10 ENCOUNTER — Encounter
Admission: RE | Admit: 2023-12-10 | Discharge: 2023-12-10 | Disposition: A | Discharge status: Home / Self Care | Source: Ambulatory Visit | Attending: Specialist | Admitting: Specialist

## 2023-12-10 HISTORY — DX: Pneumonia, unspecified organism: J18.9

## 2023-12-10 HISTORY — DX: Gastro-esophageal reflux disease without esophagitis: K21.9

## 2023-12-10 HISTORY — DX: Ganglion, unspecified wrist: M67.439

## 2023-12-10 NOTE — Patient Instructions (Addendum)
 Your procedure is scheduled on: 12/17/23 - Thursday Report to the Registration Desk on the 1st floor of the [facility]. To find out your arrival time, please call [phone] between 1PM - 3PM on: 12/16/23 - Wednesday If your arrival time is 6:00 am, do not arrive before that time as the [facility] entrance doors do not open until 6:00 am.  REMEMBER: Instructions that are not followed completely may result in serious medical risk, up to and including death; or upon the discretion of your surgeon and anesthesiologist your surgery may need to be rescheduled.  Do not eat food after midnight the night before surgery.  No gum chewing or hard candies.  You may however, drink CLEAR liquids up to 2 hours before you are scheduled to arrive for your surgery. Do not drink anything within 2 hours of your scheduled arrival time.  Clear liquids include: - water   - apple juice without pulp - gatorade (not RED colors) - black coffee or tea (Do NOT add milk or creamers to the coffee or tea) Do NOT drink anything that is not on this list.   One week prior to surgery: Stop Anti-inflammatories (NSAIDS) such as Advil , Aleve, Ibuprofen , Motrin , Naproxen, Naprosyn and Aspirin based products such as Excedrin, Goody's Powder, BC Powder. You may take Tylenol if needed for pain up until the day of surgery.  Stop ANY OVER THE COUNTER supplements until after surgery : Multivitamin   ON THE DAY OF SURGERY ONLY TAKE THESE MEDICATIONS WITH SIPS OF WATER :   omeprazole (PRILOSEC)  doxycycline (MONODOX)     No Alcohol for 24 hours before or after surgery.  No Smoking including e-cigarettes for 24 hours before surgery.  No chewable tobacco products for at least 6 hours before surgery.  No nicotine patches on the day of surgery.  Do not use any recreational drugs for at least a week (preferably 2 weeks) before your surgery.  Please be advised that the combination of cocaine and anesthesia may have  negative outcomes, up to and including death. If you test positive for cocaine, your surgery will be cancelled.  On the morning of surgery brush your teeth with toothpaste and water , you may rinse your mouth with mouthwash if you wish. Do not swallow any toothpaste or mouthwash.  Use CHG Soap or wipes as directed on instruction sheet.  Do not wear jewelry, make-up, hairpins, clips or nail polish.  For welded (permanent) jewelry: bracelets, anklets, waist bands, etc.  Please have this removed prior to surgery.  If it is not removed, there is a chance that hospital personnel will need to cut it off on the day of surgery.  Do not wear lotions, powders, or perfumes.   Do not shave body hair from the neck down 48 hours before surgery.  Contact lenses, hearing aids and dentures may not be worn into surgery.  Do not bring valuables to the hospital. [facility] is not responsible for any missing/lost belongings or valuables.   Notify your doctor if there is any change in your medical condition (cold, fever, infection).  Wear comfortable clothing (specific to your surgery type) to the hospital.  After surgery, you can help prevent lung complications by doing breathing exercises.  Take deep breaths and cough every 1-2 hours. Your doctor may order a device called an Incentive Spirometer to help you take deep breaths.  When coughing or sneezing, hold a pillow firmly against your incision with both hands. This is called "splinting." Doing this  helps protect your incision. It also decreases belly discomfort.  If you are being admitted to the hospital overnight, leave your suitcase in the car. After surgery it may be brought to your room.  In case of increased patient census, it may be necessary for you, the patient, to continue your postoperative care in the Same Day Surgery department.  If you are being discharged the day of surgery, you will not be allowed to drive home. You will need a  responsible individual to drive you home and stay with you for 24 hours after surgery.   If you are taking public transportation, you will need to have a responsible individual with you.  Please call the Pre-admissions Testing Dept. at [phone] if you have any questions about these instructions.  Surgery Visitation Policy:  Patients having surgery or a procedure may have two visitors.  Children under the age of 8 must have an adult with them who is not the patient.  Inpatient Visitation:    Visiting hours are 7 a.m. to 8 p.m. Up to four visitors are allowed at one time in a patient room. The visitors may rotate out with other people during the day.  One visitor age 75 or older may stay with the patient overnight and must be in the room by 8 p.m.   Merchandiser, retail to address health-related social needs:  [email or web address]

## 2023-12-17 ENCOUNTER — Ambulatory Visit: Admitting: Anesthesiology

## 2023-12-17 ENCOUNTER — Ambulatory Visit
Admission: RE | Admit: 2023-12-17 | Discharge: 2023-12-17 | Disposition: A | Discharge status: Home / Self Care | Attending: Specialist | Admitting: Specialist

## 2023-12-17 ENCOUNTER — Other Ambulatory Visit: Payer: Self-pay

## 2023-12-17 ENCOUNTER — Encounter: Payer: Self-pay | Admitting: Specialist

## 2023-12-17 ENCOUNTER — Encounter
Admission: RE | Disposition: A | Discharge status: Home / Self Care | Payer: Self-pay | Source: Home / Self Care | Attending: Specialist

## 2023-12-17 DIAGNOSIS — K219 Gastro-esophageal reflux disease without esophagitis: Secondary | ICD-10-CM | POA: Diagnosis not present

## 2023-12-17 DIAGNOSIS — M67432 Ganglion, left wrist: Secondary | ICD-10-CM | POA: Diagnosis present

## 2023-12-17 DIAGNOSIS — Z87891 Personal history of nicotine dependence: Secondary | ICD-10-CM | POA: Insufficient documentation

## 2023-12-17 HISTORY — PX: GANGLION CYST EXCISION: SHX1691

## 2023-12-17 SURGERY — EXCISION, GANGLION CYST, WRIST
Anesthesia: General | Site: Wrist | Laterality: Left

## 2023-12-17 MED ORDER — PHENYLEPHRINE 80 MCG/ML (10ML) SYRINGE FOR IV PUSH (FOR BLOOD PRESSURE SUPPORT)
PREFILLED_SYRINGE | INTRAVENOUS | Status: DC | PRN
  Administered 2023-12-17 (×5): 80 ug via INTRAVENOUS

## 2023-12-17 MED ORDER — BUPIVACAINE HCL (PF) 0.5 % IJ SOLN
INTRAMUSCULAR | Status: AC
  Filled 2023-12-17: qty 30

## 2023-12-17 MED ORDER — EPHEDRINE SULFATE-NACL 50-0.9 MG/10ML-% IV SOSY
PREFILLED_SYRINGE | INTRAVENOUS | Status: DC | PRN
  Administered 2023-12-17: 5 mg via INTRAVENOUS

## 2023-12-17 MED ORDER — ORAL CARE MOUTH RINSE: 15.0000 mL | Freq: Once | OROMUCOSAL | Status: AC

## 2023-12-17 MED ORDER — OXYCODONE HCL 5 MG PO TABS
5.0000 mg | ORAL_TABLET | Freq: Once | ORAL | Status: AC | PRN
  Administered 2023-12-17: 5 mg via ORAL

## 2023-12-17 MED ORDER — CHLORHEXIDINE GLUCONATE 0.12 % MT SOLN
15.0000 mL | Freq: Once | OROMUCOSAL | Status: AC
  Administered 2023-12-17: 15 mL via OROMUCOSAL

## 2023-12-17 MED ORDER — BUPIVACAINE HCL 0.5 % IJ SOLN
INTRAMUSCULAR | Status: DC | PRN
  Administered 2023-12-17: 16 mL via INTRA_ARTICULAR

## 2023-12-17 MED ORDER — CEFAZOLIN SODIUM-DEXTROSE 2-4 GM/100ML-% IV SOLN
2.0000 g | INTRAVENOUS | Status: AC
  Administered 2023-12-17: 2 g via INTRAVENOUS

## 2023-12-17 MED ORDER — FENTANYL CITRATE (PF) 100 MCG/2ML IJ SOLN
INTRAMUSCULAR | Status: DC | PRN
  Administered 2023-12-17 (×2): 50 ug via INTRAVENOUS

## 2023-12-17 MED ORDER — OXYCODONE HCL 5 MG PO TABS
ORAL_TABLET | ORAL | Status: AC
  Filled 2023-12-17: qty 1

## 2023-12-17 MED ORDER — KETOROLAC TROMETHAMINE 30 MG/ML IJ SOLN
INTRAMUSCULAR | Status: DC | PRN
  Administered 2023-12-17: 30 mg via INTRAVENOUS

## 2023-12-17 MED ORDER — DEXAMETHASONE SODIUM PHOSPHATE 10 MG/ML IJ SOLN
INTRAMUSCULAR | Status: DC | PRN
  Administered 2023-12-17: 10 mg via INTRAVENOUS

## 2023-12-17 MED ORDER — MIDAZOLAM HCL 2 MG/2ML IJ SOLN
INTRAMUSCULAR | Status: DC | PRN
  Administered 2023-12-17: 1 mg via INTRAVENOUS
  Administered 2023-12-17: 3 mg via INTRAVENOUS

## 2023-12-17 MED ORDER — GABAPENTIN 300 MG PO CAPS
300.0000 mg | ORAL_CAPSULE | ORAL | Status: AC
  Administered 2023-12-17: 300 mg via ORAL

## 2023-12-17 MED ORDER — FENTANYL CITRATE (PF) 100 MCG/2ML IJ SOLN
INTRAMUSCULAR | Status: AC
Start: 2023-12-17 — End: 2023-12-17
  Filled 2023-12-17: qty 2

## 2023-12-17 MED ORDER — CHLORHEXIDINE GLUCONATE 0.12 % MT SOLN
OROMUCOSAL | Status: AC
  Filled 2023-12-17: qty 15

## 2023-12-17 MED ORDER — LACTATED RINGERS IV SOLN: INTRAVENOUS | Status: DC

## 2023-12-17 MED ORDER — ONDANSETRON HCL 4 MG/2ML IJ SOLN
INTRAMUSCULAR | Status: DC | PRN
  Administered 2023-12-17: 4 mg via INTRAVENOUS

## 2023-12-17 MED ORDER — MIDAZOLAM HCL 2 MG/2ML IJ SOLN
INTRAMUSCULAR | Status: AC
  Filled 2023-12-17: qty 2

## 2023-12-17 MED ORDER — GABAPENTIN 400 MG PO CAPS
400.0000 mg | ORAL_CAPSULE | Freq: Three times a day (TID) | ORAL | 3 refills | Status: AC
Start: 2023-12-17 — End: ?

## 2023-12-17 MED ORDER — 0.9 % SODIUM CHLORIDE (POUR BTL) OPTIME
TOPICAL | Status: DC | PRN
  Administered 2023-12-17: 500 mL

## 2023-12-17 MED ORDER — FENTANYL CITRATE (PF) 100 MCG/2ML IJ SOLN
INTRAMUSCULAR | Status: AC
  Filled 2023-12-17: qty 2

## 2023-12-17 MED ORDER — OXYCODONE HCL 5 MG/5ML PO SOLN: 5.0000 mg | Freq: Once | ORAL | Status: AC | PRN

## 2023-12-17 MED ORDER — GABAPENTIN 300 MG PO CAPS
ORAL_CAPSULE | ORAL | Status: AC
  Filled 2023-12-17: qty 1

## 2023-12-17 MED ORDER — CEFAZOLIN SODIUM-DEXTROSE 2-4 GM/100ML-% IV SOLN
INTRAVENOUS | Status: AC
  Filled 2023-12-17: qty 100

## 2023-12-17 MED ORDER — FENTANYL CITRATE (PF) 100 MCG/2ML IJ SOLN
25.0000 ug | INTRAMUSCULAR | Status: DC | PRN
  Administered 2023-12-17 (×2): 25 ug via INTRAVENOUS

## 2023-12-17 MED ORDER — TRAMADOL HCL 50 MG PO TABS: 50.0000 mg | ORAL_TABLET | Freq: Four times a day (QID) | ORAL | 0 refills | Status: AC | PRN

## 2023-12-17 MED ORDER — CHLORHEXIDINE GLUCONATE CLOTH 2 % EX PADS: 6.0000 | MEDICATED_PAD | Freq: Once | CUTANEOUS | Status: DC

## 2023-12-17 MED ORDER — MELOXICAM 15 MG PO TABS: 15.0000 mg | ORAL_TABLET | Freq: Every day | ORAL | 3 refills | Status: AC

## 2023-12-17 MED ORDER — PROPOFOL 10 MG/ML IV BOLUS
INTRAVENOUS | Status: DC | PRN
  Administered 2023-12-17: 200 mg via INTRAVENOUS

## 2023-12-17 SURGICAL SUPPLY — 30 items
BENZOIN TINCTURE PRP APPL 2/3 (GAUZE/BANDAGES/DRESSINGS) IMPLANT
BLADE SURG MINI STRL (BLADE) ×1 IMPLANT
BNDG ESMARCH 4X12 STRL LF (GAUZE/BANDAGES/DRESSINGS) ×1 IMPLANT
CHLORAPREP W/TINT 26 (MISCELLANEOUS) ×1 IMPLANT
CUFF TOURN SGL QUICK 18X4 (TOURNIQUET CUFF) IMPLANT
DRSG GAUZE FLUFF 36X18 (GAUZE/BANDAGES/DRESSINGS) ×1 IMPLANT
ELECTRODE REM PT RTRN 9FT ADLT (ELECTROSURGICAL) ×1 IMPLANT
GAUZE SPONGE 4X4 12PLY STRL (GAUZE/BANDAGES/DRESSINGS) ×1 IMPLANT
GAUZE XEROFORM 1X8 LF (GAUZE/BANDAGES/DRESSINGS) ×1 IMPLANT
GLOVE SURG ORTHO 8.0 STRL STRW (GLOVE) ×1 IMPLANT
GOWN STRL REUS W/ TWL LRG LVL3 (GOWN DISPOSABLE) ×1 IMPLANT
GOWN STRL REUS W/TWL LRG LVL4 (GOWN DISPOSABLE) ×1 IMPLANT
KIT TURNOVER KIT A (KITS) IMPLANT
MANIFOLD NEPTUNE II (INSTRUMENTS) ×1 IMPLANT
NS IRRIG 500ML POUR BTL (IV SOLUTION) ×1 IMPLANT
PACK EXTREMITY ARMC (MISCELLANEOUS) ×1 IMPLANT
PAD CAST 4YDX4 CTTN HI CHSV (CAST SUPPLIES) ×1 IMPLANT
PAD PREP OB/GYN DISP 24X41 (PERSONAL CARE ITEMS) ×1 IMPLANT
PENCIL SMOKE EVACUATOR (MISCELLANEOUS) ×1 IMPLANT
SPLINT CAST 1 STEP 3X12 (MISCELLANEOUS) ×1 IMPLANT
STOCKINETTE 48X4 2 PLY STRL (GAUZE/BANDAGES/DRESSINGS) ×1 IMPLANT
STOCKINETTE BIAS CUT 4 980044 (GAUZE/BANDAGES/DRESSINGS) ×1 IMPLANT
STOCKINETTE STRL 4IN 9604848 (GAUZE/BANDAGES/DRESSINGS) ×1 IMPLANT
STRAP SAFETY 5IN WIDE (MISCELLANEOUS) ×1 IMPLANT
STRIP CLOSURE SKIN 1/2X4 (GAUZE/BANDAGES/DRESSINGS) IMPLANT
SUT VIC AB 3-0 SH 8-18 (SUTURE) IMPLANT
SUT VIC AB 4-0 SH 27XANBCTRL (SUTURE) IMPLANT
SUTURE ETHLN 4-0 FS2 18XMF BLK (SUTURE) ×1 IMPLANT
TRAP FLUID SMOKE EVACUATOR (MISCELLANEOUS) ×1 IMPLANT
WATER STERILE IRR 500ML POUR (IV SOLUTION) ×1 IMPLANT

## 2023-12-17 NOTE — Op Note (Signed)
 12/17/2023  8:51 AM  PATIENT:  Natasha Marshall    PRE-OPERATIVE DIAGNOSIS:  F32.567 Ganglion, left wrist.  POST-OPERATIVE DIAGNOSIS:  Same  PROCEDURE:  EXCISION, GANGLION CYST, WRIST  SURGEON:  Kayla FORBES Pinal, MD  ANESTHESIA:   General  TOURNIQUET TIME:  29  MIN  PREOPERATIVE INDICATIONS:  [name] is a  53 y.o. female with a diagnosis of M67.432 Ganglion, left wrist. who failed conservative measures and elected for surgical management.    The risks benefits and alternatives were discussed with the patient preoperatively including but not limited to the risks of infection, bleeding, nerve injury, cardiopulmonary complications, the need for revision surgery, among others, and the patient was willing to proceed.   OPERATIVE FINDINGS: Large dorsal ganglion extending down into the wrist joint.  OPERATIVE PROCEDURE: The patient was brought to the operating room where they underwent satisfactory general LMA anesthesia.  The  arm was prepped and draped in sterile fashion.  Esmarch was applied and tourniquet inflated to 250 mmHg. .  A short transverse incision was made over the dorsal aspect of the  wrist.  Dissection was carried out carefully under loupe magnification with fine scissors.  There is a large ganglion with a thick stalk that extended to the wrist capsule and into the joint between the exit extensors.  This was followed all the way into the wrist joint and amputated.  Capsule was closed with a 3-0 Vicryl suture.  The wound was irrigated and subcutaneous tissue closed with 4-0 Vicryl and the skin with 4-0 nylon.  Sponge and needle Barlowe were correct.  1/2%  Marcaine  was placed in the wound.  A dry sterile compression hand dressing with volar splint was applied.  Tourniquet was deflated with good return of blood flow to the hand.  The patient was awakened and taken to recovery in good condition.    Kayla FORBES Pinal, M.D.

## 2023-12-17 NOTE — Anesthesia Postprocedure Evaluation (Signed)
 Anesthesia Post Note  Patient: Lezette Polendo  Procedure(s) Performed: EXCISION, GANGLION CYST, WRIST (Left: Wrist)  Patient location during evaluation: PACU Anesthesia Type: General Level of consciousness: awake and alert Pain management: pain level controlled Vital Signs Assessment: post-procedure vital signs reviewed and stable Respiratory status: spontaneous breathing, nonlabored ventilation, respiratory function stable and patient connected to nasal cannula oxygen Cardiovascular status: blood pressure returned to baseline and stable Postop Assessment: no apparent nausea or vomiting Anesthetic complications: no   No notable events documented.   Last Vitals:  Vitals:   12/17/23 0924 12/17/23 0942  BP: 112/69 123/74  Pulse: 85 75  Resp: 13 20  Temp: (!) 36.1 C (!) 34.9 C  SpO2: 93% 93%    Last Pain:  Vitals:   12/17/23 0942  TempSrc: Temporal  PainSc: 4                  Lendia LITTIE Mae

## 2023-12-17 NOTE — Transfer of Care (Signed)
 Immediate Anesthesia Transfer of Care Note  Patient: Natasha Marshall  Procedure(s) Performed: EXCISION, GANGLION CYST, WRIST (Left: Wrist)  Patient Location: PACU  Anesthesia Type:General  Level of Consciousness: drowsy  Airway & Oxygen Therapy: Patient Spontanous Breathing and Patient connected to face mask oxygen  Post-op Assessment: Report given to RN, Post -op Vital signs reviewed and stable, and Patient moving all extremities  Post vital signs: Reviewed and stable  Last Vitals:  Vitals Value Taken Time  BP 90/61 12/17/23 08:50  Temp    Pulse 79 12/17/23 08:52  Resp 12 12/17/23 08:52  SpO2 96 % 12/17/23 08:52  Vitals shown include unfiled device data.  Last Pain:  Vitals:   12/17/23 0649  TempSrc: Temporal  PainSc: 3       Patients Stated Pain Goal: 0 (12/17/23 9350)  Complications: No notable events documented.

## 2023-12-17 NOTE — H&P (Signed)
THE PATIENT WAS SEEN PRIOR TO SURGERY TODAY.  HISTORY, ALLERGIES, HOME MEDICATIONS AND OPERATIVE PROCEDURE WERE REVIEWED. RISKS AND BENEFITS OF SURGERY DISCUSSED WITH PATIENT AGAIN.  NO CHANGES FROM INITIAL HISTORY AND PHYSICAL NOTED.    

## 2023-12-17 NOTE — H&P (Signed)
 PREOPERATIVE H&P  Chief Complaint: [phone] Ganglion, left wrist Left wrist ganglion excision  HPI: Natasha Marshall is a 53 y.o. female who presents for preoperative history and physical with a diagnosis of M67.432 Ganglion, left wrist.  The patient has a large dorsal ganglion which is quite bothersome with daily activities. Pain with the extremes of motion.  She has failed conservative treatment occluding aspiration, medication, and splinting. Left wrist ganglion excision. Symptoms are rated as moderate to severe, and have been worsening.  This is significantly impairing activities of daily living.  She has elected for surgical management.   Past Medical History:  Diagnosis Date   Anxiety    Breast mass, left    benign   Depression    Dorsal wrist ganglion    left   GERD (gastroesophageal reflux disease)    Optical Rosacea    Orthodontics    permanent retainer - bottom, front   Pneumonia    Vertigo    1 episode summer 2021   Past Surgical History:  Procedure Laterality Date   COLONOSCOPY WITH PROPOFOL  N/A 01/23/2023   Procedure: COLONOSCOPY WITH PROPOFOL ;  Surgeon: Jinny Carmine, MD;  Location: [facility];  Service: Endoscopy;  Laterality: N/A;   POLYPECTOMY N/A 01/23/2023   Procedure: POLYPECTOMY;  Surgeon: Jinny Carmine, MD;  Location: [facility];  Service: Endoscopy;  Laterality: N/A;   TONSILLECTOMY  1992   TUBAL LIGATION  2004   TUBAL LIGATION     Social History   Socioeconomic History   Marital status: Divorced    Spouse name: Not on file   Number of children: 2   Years of education: 16   Highest education level: Not on file  Occupational History   Not on file  Tobacco Use   Smoking status: Former   Smokeless tobacco: Never   Tobacco comments:    social as teenager for about 6 mos  Vaping Use   Vaping status: Never Used  Substance and Sexual Activity   Alcohol use: Yes    Comment: social   Drug use: No   Sexual activity: Yes    Birth  control/protection: Surgical    Comment: Tubal ligation   Other Topics Concern   Not on file  Social History Narrative   Lives alone   Social Drivers of Health   Financial Resource Strain: Not on file  Food Insecurity: Not on file  Transportation Needs: Not on file  Physical Activity: Not on file  Stress: Not on file  Social Connections: Not on file   Family History  Problem Relation Age of Onset   Basal cell carcinoma Maternal Grandmother 70       skin ca   Colon cancer Maternal Grandmother 64   COPD Maternal Grandmother    Coronary artery disease Maternal Grandmother 54   Stroke Maternal Grandmother    Cancer Maternal Grandmother 61       uterine    Breast cancer Neg Hx    No Known Allergies Prior to Admission medications   Medication Sig Start Date End Date Taking? Authorizing Provider  doxycycline (MONODOX) 50 MG capsule Take 50 mg by mouth daily.   Yes [provider]  Multiple Vitamins-Minerals (WOMENS ONE DAILY) TABS Take by mouth.   Yes [provider]  omeprazole (PRILOSEC) 20 MG capsule Take 20 mg by mouth daily.   Yes [provider]  IVERMECTIN PO Take by mouth as needed.    [provider]     Positive  ROS: All other systems have been reviewed and were otherwise negative with the exception of those mentioned in the HPI and as above.  Physical Exam: General: Alert, no acute distress Cardiovascular: No pedal edema. Heart is regular and without murmur.  Respiratory: No cyanosis, no use of accessory musculature. Lungs are clear. GI: No organomegaly, abdomen is soft and non-tender Skin: No lesions in the area of chief complaint Neurologic: Sensation intact distally Psychiatric: Patient is competent for consent with normal mood and affect Lymphatic: No axillary or cervical lymphadenopathy  MUSCULOSKELETAL: Large dorsal ganglion left wrist.  This is fairly mobile.  Range of motion of the wrist is good with minimal pain.  Grip  is good.  Neurovascular status good.  The skin is intact.  Assessment: M67.432 Ganglion, left wrist Left wrist ganglion excision  Plan: Plan for Procedure(s): EXCISION, GANGLION CYST, WRIST  The risks benefits and alternatives were discussed with the patient including but not limited to the risks of nonoperative treatment, versus surgical intervention including infection, bleeding, nerve injury,  blood clots, cardiopulmonary complications, morbidity, mortality, among others, and they were willing to proceed.   Kayla FORBES Pinal, MD [phone]   12/17/2023 7:35 AM

## 2023-12-17 NOTE — Anesthesia Procedure Notes (Signed)
 Procedure Name: LMA Insertion Date/Time: 12/17/2023 7:50 AM  Performed by: Vannie Smaller, CRNAPre-anesthesia Checklist: Patient identified, Patient being monitored, Timeout performed, Emergency Drugs available and Suction available Patient Re-evaluated:Patient Re-evaluated prior to induction Oxygen Delivery Method: Circle system utilized Preoxygenation: Pre-oxygenation with 100% oxygen Induction Type: IV induction Ventilation: Mask ventilation without difficulty LMA: LMA inserted LMA Size: 4.0 Tube type: Oral Number of attempts: 1 Placement Confirmation: positive ETCO2 and breath sounds checked- equal and bilateral Tube secured with: Tape Dental Injury: Teeth and Oropharynx as per pre-operative assessment

## 2023-12-17 NOTE — Anesthesia Preprocedure Evaluation (Signed)
 Anesthesia Evaluation  Patient identified by MRN, date of birth, ID band Patient awake    Reviewed: Allergy & Precautions, NPO status , Patient's Chart, lab work & pertinent test results  History of Anesthesia Complications Negative for: history of anesthetic complications  Airway Mallampati: III  TM Distance: >3 FB Neck ROM: full    Dental no notable dental hx.    Pulmonary neg pulmonary ROS, former smoker   Pulmonary exam normal        Cardiovascular negative cardio ROS Normal cardiovascular exam     Neuro/Psych negative neurological ROS  negative psych ROS   GI/Hepatic Neg liver ROS,GERD  ,,  Endo/Other  negative endocrine ROS    Renal/GU      Musculoskeletal   Abdominal   Peds  Hematology negative hematology ROS (+)   Anesthesia Other Findings Past Medical History: No date: Anxiety No date: Breast mass, left     Comment:  benign No date: Depression No date: Dorsal wrist ganglion     Comment:  left No date: GERD (gastroesophageal reflux disease) No date: Optical Rosacea No date: Orthodontics     Comment:  permanent retainer - bottom, front No date: Pneumonia No date: Vertigo     Comment:  1 episode summer 2021  Past Surgical History: 01/23/2023: COLONOSCOPY WITH PROPOFOL ; N/A     Comment:  Procedure: COLONOSCOPY WITH PROPOFOL ;  Surgeon: Jinny Carmine, MD;  Location: [facility];  Service:               Endoscopy;  Laterality: N/A; 01/23/2023: POLYPECTOMY; N/A     Comment:  Procedure: POLYPECTOMY;  Surgeon: Jinny Carmine, MD;                Location: [facility];  Service: Endoscopy;                Laterality: N/A; 1992: TONSILLECTOMY 2004: TUBAL LIGATION No date: TUBAL LIGATION  BMI    Body Mass Index: 37.13 kg/m      Reproductive/Obstetrics negative OB ROS                              Anesthesia Physical Anesthesia Plan  ASA:  2  Anesthesia Plan: General LMA   Post-op Pain Management: Toradol  IV (intra-op)*, Ofirmev IV (intra-op)* and Gabapentin  PO (pre-op)*   Induction: Intravenous  PONV Risk Score and Plan: Dexamethasone , Ondansetron , Midazolam  and Treatment may vary due to age or medical condition  Airway Management Planned: Natural Airway and Nasal Cannula  Additional Equipment:   Intra-op Plan:   Post-operative Plan:   Informed Consent: I have reviewed the patients History and Physical, chart, labs and discussed the procedure including the risks, benefits and alternatives for the proposed anesthesia with the patient or authorized representative who has indicated his/her understanding and acceptance.     Dental Advisory Given  Plan Discussed with: Anesthesiologist, CRNA and Surgeon  Anesthesia Plan Comments: (Patient consented for risks of anesthesia including but not limited to:  - adverse reactions to medications - risk of airway placement if required - damage to eyes, teeth, lips or other oral mucosa - nerve damage due to positioning  - sore throat or hoarseness - Damage to heart, brain, nerves, lungs, other parts of body or loss of life  Patient voiced understanding and assent.)         Anesthesia Quick  Evaluation

## 2023-12-18 ENCOUNTER — Encounter: Payer: Self-pay | Admitting: Specialist

## 2023-12-18 LAB — SURGICAL PATHOLOGY
